# Patient Record
Sex: Female | Born: 1960 | Race: Black or African American | Hispanic: No | Marital: Single | Smoking: Never smoker
Health system: Southern US, Community
[De-identification: ages and names within clinical notes are randomized; demographics above are authoritative.]

## PROBLEM LIST (undated history)

## (undated) DIAGNOSIS — E785 Hyperlipidemia, unspecified: Secondary | ICD-10-CM

## (undated) DIAGNOSIS — M810 Age-related osteoporosis without current pathological fracture: Secondary | ICD-10-CM

## (undated) DIAGNOSIS — I10 Essential (primary) hypertension: Secondary | ICD-10-CM

## (undated) DIAGNOSIS — E119 Type 2 diabetes mellitus without complications: Secondary | ICD-10-CM

## (undated) DIAGNOSIS — M1990 Unspecified osteoarthritis, unspecified site: Secondary | ICD-10-CM

## (undated) DIAGNOSIS — N871 Moderate cervical dysplasia: Secondary | ICD-10-CM

## (undated) HISTORY — DX: Age-related osteoporosis without current pathological fracture: M81.0

## (undated) HISTORY — PX: CHOLECYSTECTOMY: SHX55

## (undated) HISTORY — PX: BREAST CYST ASPIRATION: SHX578

## (undated) HISTORY — DX: Hyperlipidemia, unspecified: E78.5

## (undated) HISTORY — PX: TUBAL LIGATION: SHX77

---

## 2006-04-17 ENCOUNTER — Other Ambulatory Visit: Admission: RE | Admit: 2006-04-17 | Discharge: 2006-04-17 | Payer: Self-pay | Admitting: Obstetrics and Gynecology

## 2006-09-04 ENCOUNTER — Encounter: Admission: RE | Admit: 2006-09-04 | Discharge: 2006-09-04 | Payer: Self-pay | Admitting: Obstetrics and Gynecology

## 2007-09-10 ENCOUNTER — Encounter: Admission: RE | Admit: 2007-09-10 | Discharge: 2007-09-10 | Payer: Self-pay | Admitting: Obstetrics and Gynecology

## 2007-11-21 ENCOUNTER — Encounter: Admission: RE | Admit: 2007-11-21 | Discharge: 2007-11-21 | Payer: Self-pay | Admitting: Internal Medicine

## 2008-01-29 ENCOUNTER — Emergency Department (HOSPITAL_COMMUNITY): Admission: EM | Admit: 2008-01-29 | Discharge: 2008-01-29 | Payer: Self-pay | Admitting: Emergency Medicine

## 2008-09-30 ENCOUNTER — Encounter: Admission: RE | Admit: 2008-09-30 | Discharge: 2008-09-30 | Payer: Self-pay | Admitting: Obstetrics and Gynecology

## 2009-10-02 ENCOUNTER — Encounter: Admission: RE | Admit: 2009-10-02 | Discharge: 2009-10-02 | Payer: Self-pay | Admitting: Obstetrics and Gynecology

## 2010-02-06 ENCOUNTER — Encounter: Admission: RE | Admit: 2010-02-06 | Discharge: 2010-02-06 | Payer: Self-pay | Admitting: Internal Medicine

## 2010-08-26 ENCOUNTER — Other Ambulatory Visit: Payer: Self-pay | Admitting: Obstetrics and Gynecology

## 2010-08-26 DIAGNOSIS — Z1231 Encounter for screening mammogram for malignant neoplasm of breast: Secondary | ICD-10-CM

## 2010-10-08 ENCOUNTER — Ambulatory Visit: Payer: Self-pay

## 2010-10-12 ENCOUNTER — Ambulatory Visit
Admission: RE | Admit: 2010-10-12 | Discharge: 2010-10-12 | Disposition: A | Discharge status: Home / Self Care | Payer: BC Managed Care – PPO | Source: Ambulatory Visit | Attending: Obstetrics and Gynecology | Admitting: Obstetrics and Gynecology

## 2010-10-12 DIAGNOSIS — Z1231 Encounter for screening mammogram for malignant neoplasm of breast: Secondary | ICD-10-CM

## 2011-08-29 ENCOUNTER — Other Ambulatory Visit: Payer: Self-pay

## 2011-08-29 ENCOUNTER — Other Ambulatory Visit: Payer: Self-pay | Admitting: Obstetrics and Gynecology

## 2011-08-29 DIAGNOSIS — Z1231 Encounter for screening mammogram for malignant neoplasm of breast: Secondary | ICD-10-CM

## 2011-10-18 ENCOUNTER — Ambulatory Visit
Admission: RE | Admit: 2011-10-18 | Discharge: 2011-10-18 | Disposition: A | Discharge status: Home / Self Care | Payer: BC Managed Care – PPO | Source: Ambulatory Visit | Attending: Obstetrics and Gynecology | Admitting: Obstetrics and Gynecology

## 2011-10-18 DIAGNOSIS — Z1231 Encounter for screening mammogram for malignant neoplasm of breast: Secondary | ICD-10-CM

## 2012-01-30 ENCOUNTER — Encounter: Payer: Self-pay | Admitting: Obstetrics and Gynecology

## 2012-05-24 ENCOUNTER — Encounter: Payer: Self-pay | Admitting: *Deleted

## 2012-05-24 ENCOUNTER — Encounter: Payer: BC Managed Care – PPO | Attending: Internal Medicine | Admitting: *Deleted

## 2012-05-24 VITALS — Ht 65.0 in | Wt 200.7 lb

## 2012-05-24 DIAGNOSIS — Z713 Dietary counseling and surveillance: Secondary | ICD-10-CM | POA: Insufficient documentation

## 2012-05-24 DIAGNOSIS — IMO0001 Reserved for inherently not codable concepts without codable children: Secondary | ICD-10-CM

## 2012-05-24 DIAGNOSIS — E119 Type 2 diabetes mellitus without complications: Secondary | ICD-10-CM | POA: Insufficient documentation

## 2012-05-24 NOTE — Progress Notes (Signed)
  HbA1c 6.5%  Patient was seen on 05/24/12 for the first of a series of three diabetes self-management courses at the [facility]. The following learning objectives were met by the patient during this course:   Defines the role of glucose and insulin  Identifies type of diabetes and pathophysiology  Defines the diagnostic criteria for diabetes and prediabetes  States the risk factors for Type 2 Diabetes  States the symptoms of Type 2 Diabetes  Defines Type 2 Diabetes treatment goals  Defines Type 2 Diabetes treatment options  States the rationale for glucose monitoring  Identifies A1C, glucose targets, and testing times  Identifies proper sharps disposal  Defines the purpose of a diabetes food plan  Identifies carbohydrate food groups  Defines effects of carbohydrate foods on glucose levels  Identifies carbohydrate choices/grams/food labels  States benefits of physical activity and effect on glucose  Review of suggested activity guidelines  Handouts given during class include:  Type 2 Diabetes: Basics Book  My Food Plan Book  Food and Activity Log  Follow-Up Plan: Attend Core 2 and Core 3 classes

## 2012-05-24 NOTE — Patient Instructions (Signed)
Goals:  Follow Diabetes Meal Plan as instructed  Eat 3 meals and 2 snacks, every 3-5 hrs  Limit carbohydrate intake to 30-45 grams carbohydrate/meal  Limit carbohydrate intake to 15 grams carbohydrate/snack  Add lean protein foods to meals/snacks  Monitor glucose levels as instructed by your doctor  Aim for 30 mins of physical activity daily  Bring food record and glucose log to your next nutrition visit 

## 2012-08-09 ENCOUNTER — Encounter: Payer: BC Managed Care – PPO | Attending: Internal Medicine | Admitting: Dietician

## 2012-08-09 DIAGNOSIS — E119 Type 2 diabetes mellitus without complications: Secondary | ICD-10-CM | POA: Insufficient documentation

## 2012-08-09 DIAGNOSIS — IMO0001 Reserved for inherently not codable concepts without codable children: Secondary | ICD-10-CM

## 2012-08-09 DIAGNOSIS — Z713 Dietary counseling and surveillance: Secondary | ICD-10-CM | POA: Insufficient documentation

## 2012-08-11 NOTE — Progress Notes (Signed)
  Patient was seen on 08/09/2012 for the second of a series of three diabetes self-management courses at the [facility]. The following learning objectives were met by the patient during this course:   Explain basic nutrition maintenance and quality assurance  Describe causes, symptoms and treatment of hypoglycemia and hyperglycemia  Explain how to manage diabetes during illness  Describe the importance of good nutrition for health and healthy eating strategies  List strategies to follow meal plan when dining out  Describe the effects of alcohol on glucose and how to use it safely  Describe problem solving skills for day-to-day glucose challenges  Describe strategies to use when treatment plan needs to change  Identify important factors involved in successful weight loss  Describe ways to remain physically active  Describe the impact of regular activity on insulin resistance   Handouts given in class:  Refrigerator magnet for Sick Day Guidelines  [facility] Oral medication/insulin handout  Nutritional Strategies For Weight Loss with Diabetes  Solving Glucose Puzzles Handout  Follow-Up Plan: Patient will attend the final class of the ADA Diabetes Self-Care Education.

## 2012-09-20 ENCOUNTER — Encounter: Payer: BC Managed Care – PPO | Attending: Internal Medicine | Admitting: *Deleted

## 2012-09-20 DIAGNOSIS — E119 Type 2 diabetes mellitus without complications: Secondary | ICD-10-CM | POA: Insufficient documentation

## 2012-09-20 DIAGNOSIS — Z713 Dietary counseling and surveillance: Secondary | ICD-10-CM | POA: Insufficient documentation

## 2012-09-20 NOTE — Progress Notes (Signed)
  Patient was seen on 09/20/12 for the third of a series of three diabetes self-management courses at the [facility]. The following learning objectives were met by the patient during this course:    Describe how diabetes changes over time   Identify diabetes complications and ways to prevent them   Describe strategies that can promote heart health including lowering blood pressure and cholesterol   Describe strategies to lower dietary fat and sodium in the diet   Identify physical activities that benefit cardiovascular health   Evaluate success in meeting personal goal   Describe the belief that they can live successfully with diabetes day to day   Establish 2-3 goals that they will plan to diligently work on until they return for the free 35-month follow-up visit  The following handouts were given in class:  3 Month Follow Up Visit handout  Goal setting handout  Class evaluation form  Your patient has established the following 3 month goals for diabetes self-care:  Count carbohydrates at most meals and snacks  Test my glucose every day  Follow-Up Plan: Patient will attend a 3 month follow-up visit for diabetes self-management education.

## 2012-10-03 ENCOUNTER — Other Ambulatory Visit: Payer: Self-pay

## 2012-10-03 DIAGNOSIS — Z1231 Encounter for screening mammogram for malignant neoplasm of breast: Secondary | ICD-10-CM

## 2012-11-01 ENCOUNTER — Ambulatory Visit
Admission: RE | Admit: 2012-11-01 | Discharge: 2012-11-01 | Disposition: A | Discharge status: Home / Self Care | Payer: BC Managed Care – PPO | Source: Ambulatory Visit

## 2012-11-01 DIAGNOSIS — Z1231 Encounter for screening mammogram for malignant neoplasm of breast: Secondary | ICD-10-CM

## 2013-01-14 ENCOUNTER — Ambulatory Visit: Payer: BC Managed Care – PPO | Admitting: *Deleted

## 2013-02-07 ENCOUNTER — Ambulatory Visit: Payer: BC Managed Care – PPO | Admitting: *Deleted

## 2013-09-24 ENCOUNTER — Other Ambulatory Visit: Payer: Self-pay

## 2013-10-07 ENCOUNTER — Other Ambulatory Visit: Payer: Self-pay | Admitting: Obstetrics and Gynecology

## 2013-10-07 DIAGNOSIS — Z1231 Encounter for screening mammogram for malignant neoplasm of breast: Secondary | ICD-10-CM

## 2013-11-05 ENCOUNTER — Encounter (INDEPENDENT_AMBULATORY_CARE_PROVIDER_SITE_OTHER): Payer: Self-pay

## 2013-11-05 ENCOUNTER — Ambulatory Visit
Admission: RE | Admit: 2013-11-05 | Discharge: 2013-11-05 | Disposition: A | Discharge status: Home / Self Care | Payer: BC Managed Care – PPO | Source: Ambulatory Visit | Attending: Obstetrics and Gynecology | Admitting: Obstetrics and Gynecology

## 2013-11-05 DIAGNOSIS — Z1231 Encounter for screening mammogram for malignant neoplasm of breast: Secondary | ICD-10-CM

## 2014-10-03 ENCOUNTER — Other Ambulatory Visit: Payer: Self-pay

## 2014-10-03 DIAGNOSIS — Z1231 Encounter for screening mammogram for malignant neoplasm of breast: Secondary | ICD-10-CM

## 2014-11-11 ENCOUNTER — Ambulatory Visit: Payer: BC Managed Care – PPO

## 2014-12-04 ENCOUNTER — Ambulatory Visit
Admission: RE | Admit: 2014-12-04 | Discharge: 2014-12-04 | Disposition: A | Discharge status: Home / Self Care | Payer: BC Managed Care – PPO | Source: Ambulatory Visit

## 2014-12-04 DIAGNOSIS — Z1231 Encounter for screening mammogram for malignant neoplasm of breast: Secondary | ICD-10-CM

## 2015-10-30 ENCOUNTER — Other Ambulatory Visit: Payer: Self-pay

## 2015-10-30 DIAGNOSIS — Z1231 Encounter for screening mammogram for malignant neoplasm of breast: Secondary | ICD-10-CM

## 2015-12-10 ENCOUNTER — Ambulatory Visit
Admission: RE | Admit: 2015-12-10 | Discharge: 2015-12-10 | Disposition: A | Discharge status: Home / Self Care | Payer: BC Managed Care – PPO | Source: Ambulatory Visit

## 2015-12-10 DIAGNOSIS — Z1231 Encounter for screening mammogram for malignant neoplasm of breast: Secondary | ICD-10-CM

## 2015-12-11 ENCOUNTER — Ambulatory Visit: Payer: BC Managed Care – PPO

## 2015-12-17 ENCOUNTER — Other Ambulatory Visit: Payer: Self-pay | Admitting: Obstetrics and Gynecology

## 2015-12-17 DIAGNOSIS — R1031 Right lower quadrant pain: Secondary | ICD-10-CM

## 2015-12-31 ENCOUNTER — Ambulatory Visit
Admission: RE | Admit: 2015-12-31 | Discharge: 2015-12-31 | Disposition: A | Discharge status: Home / Self Care | Payer: BC Managed Care – PPO | Source: Ambulatory Visit | Attending: Obstetrics and Gynecology | Admitting: Obstetrics and Gynecology

## 2015-12-31 DIAGNOSIS — R1031 Right lower quadrant pain: Secondary | ICD-10-CM

## 2015-12-31 MED ORDER — IOPAMIDOL (ISOVUE-300) INJECTION 61%
100.0000 mL | Freq: Once | INTRAVENOUS | Status: AC | PRN
  Administered 2015-12-31: 100 mL via INTRAVENOUS

## 2015-12-31 NOTE — Progress Notes (Signed)
Patient brought to nursing station at 10:53 after she began sneezing at the end of her CT scan with contrast.  Complained of her nose just non-stop running, blowing nose repeatedly with clear watery drainage on tissues.  Sounded stuffy but without hoarseness or breathing issues.  No other complaints.  Went to restroom without complications.  Observed her for about five more minutes, during which her stuffiness and runny nose resolved.  I gave patient a Benadryl 25mg  PO capsule to take with her in case her symptoms returned.  She is going straight to work at [employer] from here.  Her home is only five minutes from there.  I told her to take the Benadryl, if needed, before she left work and to go straight home before drowsiness kicks in.  She understands to call [phone] if she develops respiratory difficulties (i.e.shortness of breath).  Brita Romp, RN

## 2016-06-24 ENCOUNTER — Other Ambulatory Visit: Payer: Self-pay | Admitting: Nurse Practitioner

## 2016-06-24 ENCOUNTER — Ambulatory Visit
Admission: RE | Admit: 2016-06-24 | Discharge: 2016-06-24 | Disposition: A | Discharge status: Home / Self Care | Payer: BC Managed Care – PPO | Source: Ambulatory Visit | Attending: Nurse Practitioner | Admitting: Nurse Practitioner

## 2016-06-24 DIAGNOSIS — R1031 Right lower quadrant pain: Secondary | ICD-10-CM

## 2016-11-01 ENCOUNTER — Other Ambulatory Visit: Payer: Self-pay | Admitting: Obstetrics and Gynecology

## 2016-11-01 DIAGNOSIS — Z1231 Encounter for screening mammogram for malignant neoplasm of breast: Secondary | ICD-10-CM

## 2016-12-12 ENCOUNTER — Ambulatory Visit
Admission: RE | Admit: 2016-12-12 | Discharge: 2016-12-12 | Disposition: A | Discharge status: Home / Self Care | Payer: BC Managed Care – PPO | Source: Ambulatory Visit | Attending: Obstetrics and Gynecology | Admitting: Obstetrics and Gynecology

## 2016-12-12 DIAGNOSIS — Z1231 Encounter for screening mammogram for malignant neoplasm of breast: Secondary | ICD-10-CM

## 2017-11-07 ENCOUNTER — Other Ambulatory Visit: Payer: Self-pay | Admitting: Obstetrics and Gynecology

## 2017-11-07 DIAGNOSIS — Z1231 Encounter for screening mammogram for malignant neoplasm of breast: Secondary | ICD-10-CM

## 2017-12-13 ENCOUNTER — Ambulatory Visit
Admission: RE | Admit: 2017-12-13 | Discharge: 2017-12-13 | Disposition: A | Discharge status: Home / Self Care | Payer: BC Managed Care – PPO | Source: Ambulatory Visit | Attending: Obstetrics and Gynecology | Admitting: Obstetrics and Gynecology

## 2017-12-13 DIAGNOSIS — Z1231 Encounter for screening mammogram for malignant neoplasm of breast: Secondary | ICD-10-CM

## 2018-01-08 ENCOUNTER — Other Ambulatory Visit: Payer: Self-pay | Admitting: Internal Medicine

## 2018-01-08 DIAGNOSIS — R319 Hematuria, unspecified: Secondary | ICD-10-CM

## 2018-01-12 ENCOUNTER — Ambulatory Visit
Admission: RE | Admit: 2018-01-12 | Discharge: 2018-01-12 | Disposition: A | Discharge status: Home / Self Care | Payer: BC Managed Care – PPO | Source: Ambulatory Visit | Attending: Internal Medicine | Admitting: Internal Medicine

## 2018-01-12 DIAGNOSIS — R319 Hematuria, unspecified: Secondary | ICD-10-CM

## 2018-02-28 ENCOUNTER — Other Ambulatory Visit: Payer: Self-pay | Admitting: Obstetrics and Gynecology

## 2018-04-16 ENCOUNTER — Encounter (HOSPITAL_BASED_OUTPATIENT_CLINIC_OR_DEPARTMENT_OTHER): Payer: Self-pay | Admitting: *Deleted

## 2018-04-17 ENCOUNTER — Encounter (HOSPITAL_COMMUNITY)
Admission: RE | Admit: 2018-04-17 | Discharge: 2018-04-17 | Disposition: A | Discharge status: Home / Self Care | Payer: BC Managed Care – PPO | Source: Ambulatory Visit | Attending: Obstetrics and Gynecology | Admitting: Obstetrics and Gynecology

## 2018-04-17 ENCOUNTER — Encounter (HOSPITAL_BASED_OUTPATIENT_CLINIC_OR_DEPARTMENT_OTHER): Payer: Self-pay | Admitting: *Deleted

## 2018-04-17 ENCOUNTER — Other Ambulatory Visit: Payer: Self-pay

## 2018-04-17 DIAGNOSIS — Z01818 Encounter for other preprocedural examination: Secondary | ICD-10-CM | POA: Insufficient documentation

## 2018-04-17 LAB — BASIC METABOLIC PANEL
Anion gap: 6 (ref 5–15)
BUN: 11 mg/dL (ref 6–20)
CO2: 25 mmol/L (ref 22–32)
Calcium: 8.7 mg/dL — ABNORMAL LOW (ref 8.9–10.3)
Chloride: 108 mmol/L (ref 98–111)
Creatinine, Ser: 0.81 mg/dL (ref 0.44–1.00)
GFR calc Af Amer: >60 mL/min (ref >60)
GFR calc non Af Amer: >60 mL/min (ref >60)
Glucose, Bld: 97 mg/dL (ref 70–99)
Potassium: 3.5 mmol/L (ref 3.5–5.1)
Sodium: 139 mmol/L (ref 135–145)

## 2018-04-17 LAB — CBC
HCT: 40.8 % (ref 36.0–46.0)
Hemoglobin: 13.6 g/dL (ref 12.0–15.0)
MCH: 30.9 pg (ref 26.0–34.0)
MCHC: 33.3 g/dL (ref 30.0–36.0)
MCV: 92.7 fL (ref 80.0–100.0)
Platelets: 173 10*3/uL (ref 150–400)
RBC: 4.4 MIL/uL (ref 3.87–5.11)
RDW: 11.9 % (ref 11.5–15.5)
WBC: 6.7 10*3/uL (ref 4.0–10.5)
nRBC: 0 % (ref 0.0–0.2)

## 2018-04-17 LAB — ABO/RH: ABO/RH(D): A POS

## 2018-04-17 NOTE — Progress Notes (Signed)
Spoke with Haely Npo after midnight arrive 1000 am 04-19-18 [facility] meds to take none Driver sister adrian benton Pre op lab appointment today for cbc, bmey, type and schreen and ekg Have surgery orders in epic

## 2018-04-18 NOTE — Progress Notes (Signed)
Final ekg and cbc,bmet and type and screen done 04-17-18 on chart

## 2018-04-19 ENCOUNTER — Ambulatory Visit (HOSPITAL_BASED_OUTPATIENT_CLINIC_OR_DEPARTMENT_OTHER)
Admission: RE | Admit: 2018-04-19 | Discharge: 2018-04-19 | Disposition: A | Discharge status: Home / Self Care | Payer: BC Managed Care – PPO | Source: Other Acute Inpatient Hospital | Attending: Obstetrics and Gynecology | Admitting: Obstetrics and Gynecology

## 2018-04-19 ENCOUNTER — Encounter (HOSPITAL_BASED_OUTPATIENT_CLINIC_OR_DEPARTMENT_OTHER): Payer: Self-pay | Admitting: *Deleted

## 2018-04-19 ENCOUNTER — Ambulatory Visit (HOSPITAL_BASED_OUTPATIENT_CLINIC_OR_DEPARTMENT_OTHER): Payer: BC Managed Care – PPO | Admitting: Anesthesiology

## 2018-04-19 ENCOUNTER — Encounter (HOSPITAL_BASED_OUTPATIENT_CLINIC_OR_DEPARTMENT_OTHER)
Admission: RE | Disposition: A | Discharge status: Home / Self Care | Payer: Self-pay | Source: Other Acute Inpatient Hospital | Attending: Obstetrics and Gynecology

## 2018-04-19 ENCOUNTER — Other Ambulatory Visit: Payer: Self-pay

## 2018-04-19 DIAGNOSIS — R87613 High grade squamous intraepithelial lesion on cytologic smear of cervix (HGSIL): Secondary | ICD-10-CM | POA: Diagnosis present

## 2018-04-19 DIAGNOSIS — N870 Mild cervical dysplasia: Secondary | ICD-10-CM | POA: Insufficient documentation

## 2018-04-19 DIAGNOSIS — E119 Type 2 diabetes mellitus without complications: Secondary | ICD-10-CM | POA: Insufficient documentation

## 2018-04-19 DIAGNOSIS — E1169 Type 2 diabetes mellitus with other specified complication: Secondary | ICD-10-CM

## 2018-04-19 DIAGNOSIS — E785 Hyperlipidemia, unspecified: Secondary | ICD-10-CM | POA: Insufficient documentation

## 2018-04-19 DIAGNOSIS — I10 Essential (primary) hypertension: Secondary | ICD-10-CM | POA: Diagnosis present

## 2018-04-19 HISTORY — DX: Essential (primary) hypertension: I10

## 2018-04-19 HISTORY — DX: Unspecified osteoarthritis, unspecified site: M19.90

## 2018-04-19 HISTORY — DX: Type 2 diabetes mellitus without complications: E11.9

## 2018-04-19 HISTORY — PX: CERVICAL CONIZATION W/BX: SHX1330

## 2018-04-19 HISTORY — DX: Moderate cervical dysplasia: N87.1

## 2018-04-19 LAB — TYPE AND SCREEN
ABO/RH(D): A POS
Antibody Screen: NEGATIVE

## 2018-04-19 SURGERY — CONE BIOPSY, CERVIX
Anesthesia: Monitor Anesthesia Care | Site: Vagina

## 2018-04-19 MED ORDER — DEXMEDETOMIDINE HCL IN NACL 200 MCG/50ML IV SOLN
INTRAVENOUS | Status: AC
  Filled 2018-04-19: qty 50

## 2018-04-19 MED ORDER — LIDOCAINE 2% (20 MG/ML) 5 ML SYRINGE
INTRAMUSCULAR | Status: DC | PRN
  Administered 2018-04-19: 60 mg via INTRAVENOUS

## 2018-04-19 MED ORDER — ONDANSETRON HCL 4 MG/2ML IJ SOLN
INTRAMUSCULAR | Status: DC | PRN
  Administered 2018-04-19: 4 mg via INTRAVENOUS

## 2018-04-19 MED ORDER — DEXMEDETOMIDINE HCL 200 MCG/2ML IV SOLN
INTRAVENOUS | Status: DC | PRN
  Administered 2018-04-19: 8 ug via INTRAVENOUS
  Administered 2018-04-19: 24 ug via INTRAVENOUS
  Administered 2018-04-19: 8 ug via INTRAVENOUS

## 2018-04-19 MED ORDER — KETOROLAC TROMETHAMINE 60 MG/2ML IM SOLN
INTRAMUSCULAR | Status: DC | PRN
  Administered 2018-04-19: 30 mg via INTRAMUSCULAR

## 2018-04-19 MED ORDER — DEXAMETHASONE SODIUM PHOSPHATE 10 MG/ML IJ SOLN
INTRAMUSCULAR | Status: AC
  Filled 2018-04-19: qty 1

## 2018-04-19 MED ORDER — KETOROLAC TROMETHAMINE 30 MG/ML IJ SOLN
INTRAMUSCULAR | Status: AC
  Filled 2018-04-19: qty 1

## 2018-04-19 MED ORDER — DEXAMETHASONE SODIUM PHOSPHATE 10 MG/ML IJ SOLN
INTRAMUSCULAR | Status: DC | PRN
  Administered 2018-04-19: 10 mg via INTRAVENOUS

## 2018-04-19 MED ORDER — KETOROLAC TROMETHAMINE 30 MG/ML IJ SOLN
INTRAMUSCULAR | Status: DC | PRN
  Administered 2018-04-19: 30 mg via INTRAVENOUS

## 2018-04-19 MED ORDER — IODINE STRONG (LUGOLS) 5 % PO SOLN
ORAL | Status: DC | PRN
  Administered 2018-04-19: 10 mL

## 2018-04-19 MED ORDER — IBUPROFEN 600 MG PO TABS: ORAL_TABLET | ORAL | 0 refills | Status: DC

## 2018-04-19 MED ORDER — LACTATED RINGERS IV SOLN
INTRAVENOUS | Status: DC
  Administered 2018-04-19 (×3): via INTRAVENOUS
  Filled 2018-04-19: qty 1000

## 2018-04-19 MED ORDER — MIDAZOLAM HCL 2 MG/2ML IJ SOLN
INTRAMUSCULAR | Status: AC
  Filled 2018-04-19: qty 2

## 2018-04-19 MED ORDER — FENTANYL CITRATE (PF) 100 MCG/2ML IJ SOLN
INTRAMUSCULAR | Status: AC
  Filled 2018-04-19: qty 2

## 2018-04-19 MED ORDER — LIDOCAINE HCL 2 % IJ SOLN
INTRAMUSCULAR | Status: DC | PRN
  Administered 2018-04-19: 10 mL

## 2018-04-19 MED ORDER — FENTANYL CITRATE (PF) 100 MCG/2ML IJ SOLN
INTRAMUSCULAR | Status: DC | PRN
  Administered 2018-04-19 (×4): 25 ug via INTRAVENOUS

## 2018-04-19 MED ORDER — VASOPRESSIN 20 UNIT/ML IV SOLN
INTRAVENOUS | Status: DC | PRN
  Administered 2018-04-19: 10 mL via INTRAMUSCULAR
  Administered 2018-04-19: 27 mL via INTRAMUSCULAR

## 2018-04-19 MED ORDER — MIDAZOLAM HCL 2 MG/2ML IJ SOLN
INTRAMUSCULAR | Status: DC | PRN
  Administered 2018-04-19: 2 mg via INTRAVENOUS
  Administered 2018-04-19 (×2): .5 mg via INTRAVENOUS
  Administered 2018-04-19: 1 mg via INTRAVENOUS

## 2018-04-19 MED ORDER — PROPOFOL 10 MG/ML IV BOLUS
INTRAVENOUS | Status: AC
  Filled 2018-04-19: qty 20

## 2018-04-19 MED ORDER — LIDOCAINE 2% (20 MG/ML) 5 ML SYRINGE
INTRAMUSCULAR | Status: AC
  Filled 2018-04-19: qty 5

## 2018-04-19 MED ORDER — GLYCOPYRROLATE 0.2 MG/ML IJ SOLN
INTRAMUSCULAR | Status: DC | PRN
  Administered 2018-04-19: 0.2 mg via INTRAVENOUS

## 2018-04-19 MED ORDER — PROPOFOL 500 MG/50ML IV EMUL
INTRAVENOUS | Status: AC
  Filled 2018-04-19: qty 50

## 2018-04-19 MED ORDER — ONDANSETRON HCL 4 MG/2ML IJ SOLN
INTRAMUSCULAR | Status: AC
  Filled 2018-04-19: qty 2

## 2018-04-19 MED ORDER — PROPOFOL 500 MG/50ML IV EMUL
INTRAVENOUS | Status: DC | PRN
  Administered 2018-04-19: 50 ug/kg/min via INTRAVENOUS
  Administered 2018-04-19: 25 ug/kg/min via INTRAVENOUS

## 2018-04-19 SURGICAL SUPPLY — 35 items
APPLICATOR COTTON TIP 6IN STRL (MISCELLANEOUS) IMPLANT
BLADE HEX COATED 2.75 (ELECTRODE) ×2 IMPLANT
BLADE SURG 11 STRL SS (BLADE) ×2 IMPLANT
CATH ROBINSON RED A/P 16FR (CATHETERS) ×2 IMPLANT
COVER WAND RF STERILE (DRAPES) ×1 IMPLANT
ELECT BALL LEEP 3MM BLK (ELECTRODE) IMPLANT
ELECT BALL LEEP 5MM RED (ELECTRODE) IMPLANT
ELECT LOOP LEEP RND 15X12 GRN (CUTTING LOOP)
ELECT LOOP LEEP RND 20X12 WHT (CUTTING LOOP)
ELECT REM PT RETURN 9FT ADLT (ELECTROSURGICAL)
ELECTRODE LOOP LP RND 15X12GRN (CUTTING LOOP) IMPLANT
ELECTRODE LOOP LP RND 20X12WHT (CUTTING LOOP) IMPLANT
ELECTRODE REM PT RTRN 9FT ADLT (ELECTROSURGICAL) IMPLANT
GLOVE BIOGEL PI IND STRL 7.0 (GLOVE) IMPLANT
GLOVE BIOGEL PI IND STRL 7.5 (GLOVE) IMPLANT
GLOVE BIOGEL PI INDICATOR 7.0 (GLOVE) ×1
GLOVE BIOGEL PI INDICATOR 7.5 (GLOVE) ×2
GLOVE ECLIPSE 7.0 STRL STRAW (GLOVE) ×1 IMPLANT
GLOVE SURG SS PI 6.5 STRL IVOR (GLOVE) ×3 IMPLANT
GOWN STRL REUS W/TWL LRG LVL3 (GOWN DISPOSABLE) ×2 IMPLANT
KIT TURNOVER CYSTO (KITS) ×2 IMPLANT
NDL SPNL 22GX5 LNG QUINC BK (NEEDLE) ×1 IMPLANT
NEEDLE SPNL 22GX5 LNG QUINC BK (NEEDLE) ×2 IMPLANT
PACK VAGINAL WOMENS (CUSTOM PROCEDURE TRAY) ×2 IMPLANT
PAD OB MATERNITY 4.3X12.25 (PERSONAL CARE ITEMS) ×2 IMPLANT
SCOPETTES 8  STERILE (MISCELLANEOUS) ×1
SCOPETTES 8 STERILE (MISCELLANEOUS) ×1 IMPLANT
SPONGE SURGIFOAM ABS GEL 12-7 (HEMOSTASIS) ×2 IMPLANT
SUT VIC AB 0 CT1 18XCR BRD8 (SUTURE) ×1 IMPLANT
SUT VIC AB 0 CT1 8-18 (SUTURE) ×2
SUT VIC AB 0 CT2 27 (SUTURE) IMPLANT
SYR CONTROL 10ML LL (SYRINGE) ×2 IMPLANT
SYR TB 1ML 27GX1/2 SAFE (SYRINGE) IMPLANT
SYR TB 1ML 27GX1/2 SAFETY (SYRINGE)
TOWEL OR 17X24 6PK STRL BLUE (TOWEL DISPOSABLE) ×4 IMPLANT

## 2018-04-19 NOTE — Discharge Instructions (Signed)
Cervical Conization, Care After °This sheet gives you information about how to care for yourself after your procedure. Your doctor may also give you more specific instructions. If you have problems or questions, contact your doctor. °Follow these instructions at home: °Medicines °· Take over-the-counter and prescription medicines only as told by your doctor. °· Do not take aspirin until your doctor says it is okay. °· If you take pain medicine: °? You may have constipation. To help treat this, your doctor may tell you to: °§ Drink enough fluid to keep your pee (urine) clear or pale yellow. °§ Take medicines. °§ Eat foods that are high in fiber. These include fresh fruits and vegetables, whole grains, bran, and beans. °§ Limit foods that are high in fat and sugar. These include fried foods and sweet foods. °? Do not drive or use heavy machines. °General instructions °· You can eat your usual diet unless your doctor tells you not to do so. °· Take showers for the first week. Do not take baths, swim, or use hot tubs until your doctor says it is okay. °· Do not douche, use tampons, or have sex until your doctor says it is okay. °· For 7-14 days after your procedure, avoid: °? Being very active. °? Exercising. °? Heavy lifting. °· Keep all follow-up visits as told by your doctor. This is important. °Contact a doctor if: °· You have a rash. °· You are dizzy or lightheaded. °· You feel sick to your stomach (nauseous). °· You throw up (vomit). °· You have fluid from your vagina (vaginal discharge) that smells bad. °Get help right away if: °· There are blood clots coming from your vagina. °· You have more bleeding than you would have in a normal period. For example, you soak a pad in less than 1 hour. °· You have a fever. °· You have more and more cramps. °· You pass out (faint). °· You have pain when peeing. °· Your have a lot of pain. °· Your pain gets worse. °· Your pain does not get better when you take your  medicine. °· You have blood in your pee. °· You throw up (vomit). °Summary °· After your procedure, take over-the-counter and prescription medicines only as told by your doctor. °· Do not douche, use tampons, or have sex until your doctor says it is okay. °· For about 7-14 days after your procedure, try not to exercise or lift heavy objects. °· Get help right away if you have new symptoms, or if your symptoms become worse. °This information is not intended to replace advice given to you by your health care provider. Make sure you discuss any questions you have with your health care provider. °Document Released: 03/29/2008 Document Revised: 06/22/2016 Document Reviewed: 06/22/2016 °Elsevier Interactive Patient Education © 2017 [employer] ° ° °Post Anesthesia Home Care Instructions ° °Activity: °Get plenty of rest for the remainder of the day. A responsible individual must stay with you for 24 hours following the procedure.  °For the next 24 hours, DO NOT: °-Drive a car °-Operate machinery °-Drink alcoholic beverages °-Take any medication unless instructed by your physician °-Make any legal decisions or sign important papers. ° °Meals: °Start with liquid foods such as gelatin or soup. Progress to regular foods as tolerated. Avoid greasy, spicy, heavy foods. If nausea and/or vomiting occur, drink only clear liquids until the nausea and/or vomiting subsides. Call your physician if vomiting continues. ° °Special Instructions/Symptoms: °Your throat may feel dry or sore from   from the anesthesia or the breathing tube placed in your throat during surgery. If this causes discomfort, gargle with warm salt water. The discomfort should disappear within 24 hours.

## 2018-04-19 NOTE — Op Note (Signed)
Procedure(s): CONIZATION CERVIX WITH BIOPSY Procedure Note  Nichole George female 57 y.o. 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  The endocervical canal sounded to 3 cm.  There were no Lugol's nonstaining areas on the exocervix.  Estimated Blood Loss:  Minimal         Drains: None          Blood Given: none          Specimens: Cervical cone.  Endocervical curettings.  Endometrial curettings.                Complications: None         Disposition: PACU - hemodynamically stable.         Condition: stable Procedure: The patient was taken to the operating room after appropriate identification and placed on the operating table.  After the attainment of adequate anesthesia she was placed in the lithotomy position.  A tach timeout was performed.  The perineum and outer vagina were prepped with multiple layers of Betadine and draped as a sterile field.  In and out catheterization was performed.  After draping as a sterile field a Graves speculum was placed in the vagina and a paracervical block achieved with a total of 10 cc of 2% Xylocaine in the 5 and 7:00 positions.  Static sutures were placed laterally at the 3 and 9:00 positions and held.  The cervix was infiltrated with a dilute solution of Pitressin circumferentially.  Lugol stain was applied.  Uterus was sounded and the cervix length measured.  A cone shaped specimen including the endocervix was then excised and a marking suture placed at the  12 o'clock position.  Endocervical curettings were minimal and obtained.  Endometrial curettings were likewise minimal and obtained.  The cone bed was then closed with Sturmdorf sutures in the 12 and 6:00 positions and hemostatic sutures in the 3 and 9:00 positions.  Stasis was noted to be adequate and Gelfoam placed in the conization bed.  The patient was awakened from general anesthesia and taken to the recovery room in satisfactory condition having tolerated the procedure well with sponge and instrument counts correct.  SCD hose were used throughout the entire surgery.

## 2018-04-19 NOTE — Anesthesia Postprocedure Evaluation (Signed)
Anesthesia Post Note  Patient: Nichole George  Procedure(s) Performed: CONIZATION CERVIX WITH BIOPSY (N/A Vagina )     Patient location during evaluation: PACU Anesthesia Type: MAC Level of consciousness: awake and alert Pain management: pain level controlled Vital Signs Assessment: post-procedure vital signs reviewed and stable Respiratory status: spontaneous breathing, nonlabored ventilation, respiratory function stable and patient connected to nasal cannula oxygen Cardiovascular status: stable and blood pressure returned to baseline Postop Assessment: no apparent nausea or vomiting Anesthetic complications: no    Last Vitals:  Vitals:   04/19/18 1345 04/19/18 1400  BP: 101/61 (!) 103/50  Pulse: (!) 56 64  Resp: 11 20  Temp:    SpO2: 93% 94%    Last Pain:  Vitals:   04/19/18 1430  TempSrc:   PainSc: 0-No pain                 Barnet Glasgow

## 2018-04-19 NOTE — Transfer of Care (Signed)
Immediate Anesthesia Transfer of Care Note  Patient: Nichole George  Procedure(s) Performed: Procedure(s) (LRB): CONIZATION CERVIX WITH BIOPSY (N/A)  Patient Location: PACU  Anesthesia Type: General  Level of Consciousness: awake, sedated, patient cooperative and responds to stimulation  Airway & Oxygen Therapy: Patient Spontanous Breathing and Patient connected to face mask oxygen  Post-op Assessment: Report given to PACU RN, Post -op Vital signs reviewed and stable and Patient moving all extremities  Post vital signs: Reviewed and stable  Complications: No apparent anesthesia complications

## 2018-04-19 NOTE — Anesthesia Procedure Notes (Signed)
Procedure Name: MAC Date/Time: 04/19/2018 11:55 AM Performed by: Aguila Rocher, CRNA Pre-anesthesia Checklist: Patient identified, Emergency Drugs available, Suction available, Patient being monitored and Timeout performed Patient Re-evaluated:Patient Re-evaluated prior to induction Oxygen Delivery Method: Simple face mask Preoxygenation: Pre-oxygenation with 100% oxygen Induction Type: IV induction Placement Confirmation: breath sounds checked- equal and bilateral and positive ETCO2

## 2018-04-19 NOTE — Anesthesia Preprocedure Evaluation (Addendum)
Anesthesia Evaluation  Patient identified by MRN, date of birth, ID band Patient awake    Reviewed: Allergy & Precautions, NPO status , Patient's Chart, lab work & pertinent test results  Airway Mallampati: II  TM Distance: >3 FB Neck ROM: Full    Dental no notable dental hx. (+) Dental Advisory Given   Pulmonary neg pulmonary ROS,    Pulmonary exam normal        Cardiovascular hypertension, Pt. on medications Normal cardiovascular exam     Neuro/Psych negative neurological ROS  negative psych ROS   GI/Hepatic negative GI ROS, Neg liver ROS,   Endo/Other  diabetes  Renal/GU negative Renal ROS  negative genitourinary   Musculoskeletal negative musculoskeletal ROS (+)   Abdominal   Peds negative pediatric ROS (+)  Hematology negative hematology ROS (+)   Anesthesia Other Findings   Reproductive/Obstetrics negative OB ROS                             Anesthesia Physical Anesthesia Plan  ASA: II  Anesthesia Plan: MAC   Post-op Pain Management:    Induction: Intravenous  PONV Risk Score and Plan: 3 and Ondansetron, Dexamethasone and Propofol infusion  Airway Management Planned: Natural Airway and Simple Face Mask  Additional Equipment:   Intra-op Plan:   Post-operative Plan:   Informed Consent: I have reviewed the patients History and Physical, chart, labs and discussed the procedure including the risks, benefits and alternatives for the proposed anesthesia with the patient or authorized representative who has indicated his/her understanding and acceptance.   Dental advisory given  Plan Discussed with: CRNA and Anesthesiologist  Anesthesia Plan Comments:        Anesthesia Quick Evaluation

## 2018-04-19 NOTE — H&P (Signed)
Nichole George is an 57 y.o. female. Pt admitted for further evaluation and treatment of HGSIL on ECC.  Has hx of LGSIL in 2009 with observation  as chosen management.  Pertinent Gynecological History: Menses: post-menopausal Bleeding: none Contraception: tubal ligation DES exposure: unknown Blood transfusions: none Sexually transmitted diseases: no past history Previous GYN Procedures: c- section  Last mammogram: normal Date: 12/2017 Last pap: abnormal: 2019 Date: 2019 OB History: G3, P3   Menstrual History: Menarche age: 52 No LMP recorded. Patient is postmenopausal.    Past Medical History:  Diagnosis Date  . Arthritis    left ankle and right big toe and both knees  . Cervical dysplasia, moderate   . Hyperlipidemia   . Hypertension   . Type 2 diabetes mellitus (HCC)    diet  and exercise controlled    Past Surgical History:  Procedure Laterality Date  . CESAREAN SECTION    . CHOLECYSTECTOMY    . TUBAL LIGATION      Family History  Problem Relation Age of Onset  . Cancer Other   . Hypertension Other   . Hyperlipidemia Other     Social History:  reports that she has never smoked. She has never used smokeless tobacco. She reports that she drinks alcohol. She reports that she does not use drugs.  Allergies: No Known Allergies  Medications Prior to Admission  Medication Sig Dispense Refill Last Dose  . valsartan (DIOVAN) 160 MG tablet Take 160 mg by mouth at bedtime.       Review of Systems  Constitutional: Negative.   HENT: Negative.   Respiratory: Negative.   Cardiovascular: Negative.   Gastrointestinal: Negative.   Genitourinary: Negative.   Musculoskeletal: Positive for joint pain.  Neurological: Negative.   Psychiatric/Behavioral: Negative.     Blood pressure 133/82, pulse 66, temperature (!) 97.5 F (36.4 C), temperature source Oral, resp. rate 16, height 5' 5" (1.651 m), weight 88.6 kg, SpO2 95 %. Physical Exam  Constitutional: She is oriented  to person, place, and time. She appears well-developed.  HENT:  Head: Normocephalic and atraumatic.  Eyes: EOM are normal.  Neck: Normal range of motion. Neck supple.  Cardiovascular: Normal rate.  Respiratory: Effort normal and breath sounds normal.  GI: Soft. Bowel sounds are normal.  Limited by body habitus  Genitourinary:  Genitourinary Comments: Pelvic exam:  VULVA: normal appearing vulva with no masses, tenderness or lesions,  VAGINA: atrophic,  CERVIX: normal appearing cervix without discharge or lesions,  UTERUS: enlarged to 8 week's size, irregular,exam limited by body habitus  ADNEXA: normal adnexa in size, nontender and no masses,  exam limited by body habitus.  Neurological: She is alert and oriented to person, place, and time.  Skin: Skin is warm and dry.  Psychiatric: She has a normal mood and affect.    Recent Results (from the past 2160 hour(s))  Type and screen [place]     Status: None   Collection Time: 04/17/18  4:03 PM  Result Value Ref Range   ABO/RH(D) A POS    Antibody Screen NEG    Sample Expiration 04/22/2018    Extend sample reason      NO TRANSFUSIONS OR PREGNANCY IN THE PAST 3 MONTHS Performed at [facility], [place] [address] [place], [place] [address]   CBC     Status: None   Collection Time: 04/17/18  4:11 PM  Result Value Ref Range   WBC 6.7 4.0 - 10.5 K/uL  RBC 4.40 3.87 - 5.11 MIL/uL   Hemoglobin 13.6 12.0 - 15.0 g/dL   HCT 40.8 36.0 - 46.0 %   MCV 92.7 80.0 - 100.0 fL   MCH 30.9 26.0 - 34.0 pg   MCHC 33.3 30.0 - 36.0 g/dL   RDW 11.9 11.5 - 15.5 %   Platelets 173 150 - 400 K/uL   nRBC 0.0 0.0 - 0.2 %    Comment: Performed at [facility], [place] [address] [place], [place] [address]  Basic metabolic panel     Status: Abnormal   Collection Time: 04/17/18  4:11 PM  Result Value Ref Range   Sodium 139 135 - 145 mmol/L   Potassium 3.5 3.5 - 5.1 mmol/L   Chloride 108 98 - 111  mmol/L   CO2 25 22 - 32 mmol/L   Glucose, Bld 97 70 - 99 mg/dL   BUN 11 6 - 20 mg/dL   Creatinine, Ser 0.81 0.44 - 1.00 mg/dL   Calcium 8.7 (L) 8.9 - 10.3 mg/dL   GFR calc non Af Amer >60 >60 mL/min   GFR calc Af Amer >60 >60 mL/min    Comment: (NOTE) The eGFR has been calculated using the CKD EPI equation. This calculation has not been validated in all clinical situations. eGFR's persistently <60 mL/min signify possible Chronic Kidney Disease.    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  Recommendation.  Options for observation with risk of progression to cervical cancer vx excisional treatment reviewed. Conization of the cervix recommended and risks of surgery reviewed as anesthesia, infection, bleeding and damage to adjacent organs. She wishes to proceed.Lorriane Shire P Charlis Harner 04/19/2018, 10:32 AM

## 2018-04-20 ENCOUNTER — Encounter (HOSPITAL_BASED_OUTPATIENT_CLINIC_OR_DEPARTMENT_OTHER): Payer: Self-pay | Admitting: Obstetrics and Gynecology

## 2018-06-19 ENCOUNTER — Other Ambulatory Visit: Payer: Self-pay | Admitting: Internal Medicine

## 2018-06-26 ENCOUNTER — Encounter: Payer: Self-pay | Admitting: Gastroenterology

## 2018-07-09 ENCOUNTER — Ambulatory Visit: Payer: BC Managed Care – PPO | Admitting: Internal Medicine

## 2018-07-09 ENCOUNTER — Encounter: Payer: Self-pay | Admitting: Internal Medicine

## 2018-07-09 VITALS — BP 144/86 | HR 68 | Temp 98.7°F | Ht 62.0 in | Wt 196.8 lb

## 2018-07-09 DIAGNOSIS — E66812 Obesity, class 2: Secondary | ICD-10-CM | POA: Insufficient documentation

## 2018-07-09 DIAGNOSIS — R7303 Prediabetes: Secondary | ICD-10-CM

## 2018-07-09 DIAGNOSIS — I10 Essential (primary) hypertension: Secondary | ICD-10-CM | POA: Diagnosis not present

## 2018-07-09 DIAGNOSIS — M62838 Other muscle spasm: Secondary | ICD-10-CM | POA: Diagnosis not present

## 2018-07-09 DIAGNOSIS — Z6836 Body mass index (BMI) 36.0-36.9, adult: Secondary | ICD-10-CM

## 2018-07-09 LAB — LIPID PANEL
Chol/HDL Ratio: 2.8 ratio (ref 0.0–4.4)
Cholesterol, Total: 204 mg/dL — ABNORMAL HIGH (ref 100–199)
HDL: 74 mg/dL (ref >39)
LDL Calculated: 108 mg/dL — ABNORMAL HIGH (ref 0–99)
Triglycerides: 108 mg/dL (ref 0–149)
VLDL Cholesterol Cal: 22 mg/dL (ref 5–40)

## 2018-07-09 LAB — CMP14+EGFR
ALT: 58 IU/L — ABNORMAL HIGH (ref 0–32)
AST: 51 IU/L — ABNORMAL HIGH (ref 0–40)
Albumin/Globulin Ratio: 1.8 (ref 1.2–2.2)
Albumin: 4.4 g/dL (ref 3.5–5.5)
Alkaline Phosphatase: 96 IU/L (ref 39–117)
BUN/Creatinine Ratio: 12 (ref 9–23)
BUN: 11 mg/dL (ref 6–24)
Bilirubin Total: 0.6 mg/dL (ref 0.0–1.2)
CO2: 23 mmol/L (ref 20–29)
Calcium: 9.6 mg/dL (ref 8.7–10.2)
Chloride: 102 mmol/L (ref 96–106)
Creatinine, Ser: 0.9 mg/dL (ref 0.57–1.00)
GFR calc Af Amer: 82 mL/min/{1.73_m2} (ref >59)
GFR calc non Af Amer: 71 mL/min/{1.73_m2} (ref >59)
Globulin, Total: 2.5 g/dL (ref 1.5–4.5)
Glucose: 114 mg/dL — ABNORMAL HIGH (ref 65–99)
Potassium: 4 mmol/L (ref 3.5–5.2)
Sodium: 140 mmol/L (ref 134–144)
Total Protein: 6.9 g/dL (ref 6.0–8.5)

## 2018-07-09 LAB — HEMOGLOBIN A1C
Est. average glucose Bld gHb Est-mCnc: 126 mg/dL
Hgb A1c MFr Bld: 6 % — ABNORMAL HIGH (ref 4.8–5.6)

## 2018-07-09 NOTE — Progress Notes (Signed)
Subjective:     Patient ID: Nichole George , female    DOB: Nov 13, 1960 , 58 y.o.   MRN: [id number]   Chief Complaint  Patient presents with  . Hypertension    HPI  Hypertension  This is a chronic problem. The current episode started more than 1 year ago. The problem has been gradually improving since onset. The problem is uncontrolled. Associated symptoms include neck pain. Risk factors for coronary artery disease include obesity and post-menopausal state.   She reports compliance with meds. Admits to eating liberally over the holidays.   Past Medical History:  Diagnosis Date  . Arthritis    left ankle and right big toe and both knees  . Cervical dysplasia, moderate   . Hyperlipidemia   . Hypertension   . Type 2 diabetes mellitus (HCC)    diet  and exercise controlled     Family History  Problem Relation Age of Onset  . Cancer Other   . Hypertension Other   . Hyperlipidemia Other   . Healthy Mother   . Pneumonia Father      Current Outpatient Medications:  .  valsartan (DIOVAN) 160 MG tablet, TAKE 1 TABLET BY MOUTH EVERY DAY, Disp: 90 tablet, Rfl: 2   No Known Allergies   Review of Systems  Constitutional: Negative.   Respiratory: Negative.   Cardiovascular: Negative.   Gastrointestinal: Negative.   Musculoskeletal: Positive for neck pain.  Neurological: Negative.   Psychiatric/Behavioral: Negative.      Today's Vitals   07/09/18 0844  BP: (!) 144/86  Pulse: 68  Temp: 98.7 F (37.1 C)  TempSrc: Oral  Weight: 196 lb 12.8 oz (89.3 kg)  Height: _0  (1.575 m)   Body mass index is 36 kg/m.   Objective:  Physical Exam Vitals signs and nursing note reviewed.  Constitutional:      Appearance: Normal appearance. She is obese.  HENT:     Head: Normocephalic and atraumatic.  Cardiovascular:     Rate and Rhythm: Normal rate and regular rhythm.     Heart sounds: Normal heart sounds.  Pulmonary:     Effort: Pulmonary effort is normal.     Breath  sounds: Normal breath sounds.  Skin:    General: Skin is warm.  Neurological:     General: No focal deficit present.     Mental Status: She is alert.  Psychiatric:        Mood and Affect: Mood normal.         Assessment And Plan:     1. Essential hypertension, benign  Uncontrolled. She was given information on DASH eating plan. She is encouraged to avoid adding salt to her foods and to avoid packaged foods. She will rto in 3 months for re-evaluation.   2. Prediabetes  HER A1C HAS BEEN ELEVATED IN THE PAST. I WILL CHECK AN A1C, BMET TODAY. SHE WAS ENCOURAGED TO AVOID SUGARY BEVERAGES AND PROCESSED FOODS INCLUDNG BREADS, RICE AND PASTA.  - CMP14+EGFR - Hemoglobin A1c - Lipid Profile  3. Cervical paraspinal muscle spasm  She is advised to start magnesium 250-457m nightly. She will continue with acupuncture therapy.   4. Class 2 severe obesity due to excess calories with serious comorbidity and body mass index (BMI) of 36.0 to 36.9 in adult ([facility]  She is encouraged to strive for BMI less than 30 to decrease cardiac risk. She is willing to resume her regular exercise regimen and cut back on her portion sizes.   RBailey Mech  Shawnie Dapper, MD

## 2018-07-09 NOTE — Patient Instructions (Signed)
DASH Eating Plan  DASH stands for "Dietary Approaches to Stop Hypertension." The DASH eating plan is a healthy eating plan that has been shown to reduce high blood pressure (hypertension). It may also reduce your risk for type 2 diabetes, heart disease, and stroke. The DASH eating plan may also help with weight loss.  What are tips for following this plan?    General guidelines   Avoid eating more than 2,300 mg (milligrams) of salt (sodium) a day. If you have hypertension, you may need to reduce your sodium intake to 1,500 mg a day.   Limit alcohol intake to no more than 1 drink a day for nonpregnant women and 2 drinks a day for men. One drink equals 12 oz of beer, 5 oz of wine, or 1 oz of hard liquor.   Work with your health care provider to maintain a healthy body weight or to lose weight. Ask what an ideal weight is for you.   Get at least 30 minutes of exercise that causes your heart to beat faster (aerobic exercise) most days of the week. Activities may include walking, swimming, or biking.   Work with your health care provider or diet and nutrition specialist (dietitian) to adjust your eating plan to your individual calorie needs.  Reading food labels     Check food labels for the amount of sodium per serving. Choose foods with less than 5 percent of the Daily Value of sodium. Generally, foods with less than 300 mg of sodium per serving fit into this eating plan.   To find whole grains, look for the word "whole" as the first word in the ingredient list.  Shopping   Buy products labeled as "low-sodium" or "no salt added."   Buy fresh foods. Avoid canned foods and premade or frozen meals.  Cooking   Avoid adding salt when cooking. Use salt-free seasonings or herbs instead of table salt or sea salt. Check with your health care provider or pharmacist before using salt substitutes.   Do not fry foods. Cook foods using healthy methods such as baking, boiling, grilling, and broiling instead.   Cook with  heart-healthy oils, such as olive, canola, soybean, or sunflower oil.  Meal planning   Eat a balanced diet that includes:  ? 5 or more servings of fruits and vegetables each day. At each meal, try to fill half of your plate with fruits and vegetables.  ? Up to 6-8 servings of whole grains each day.  ? Less than 6 oz of lean meat, poultry, or fish each day. A 3-oz serving of meat is about the same size as a deck of cards. One egg equals 1 oz.  ? 2 servings of low-fat dairy each day.  ? A serving of nuts, seeds, or beans 5 times each week.  ? Heart-healthy fats. Healthy fats called Omega-3 fatty acids are found in foods such as flaxseeds and coldwater fish, like sardines, salmon, and mackerel.   Limit how much you eat of the following:  ? Canned or prepackaged foods.  ? Food that is high in trans fat, such as fried foods.  ? Food that is high in saturated fat, such as fatty meat.  ? Sweets, desserts, sugary drinks, and other foods with added sugar.  ? Full-fat dairy products.   Do not salt foods before eating.   Try to eat at least 2 vegetarian meals each week.   Eat more home-cooked food and less restaurant, buffet, and fast food.     When eating at a restaurant, ask that your food be prepared with less salt or no salt, if possible.  What foods are recommended?  The items listed may not be a complete list. Talk with your dietitian about what dietary choices are best for you.  Grains  Whole-grain or whole-wheat bread. Whole-grain or whole-wheat pasta. Brown rice. Oatmeal. Quinoa. Bulgur. Whole-grain and low-sodium cereals. Pita bread. Low-fat, low-sodium crackers. Whole-wheat flour tortillas.  Vegetables  Fresh or frozen vegetables (raw, steamed, roasted, or grilled). Low-sodium or reduced-sodium tomato and vegetable juice. Low-sodium or reduced-sodium tomato sauce and tomato paste. Low-sodium or reduced-sodium canned vegetables.  Fruits  All fresh, dried, or frozen fruit. Canned fruit in natural juice (without  added sugar).  Meat and other protein foods  Skinless chicken or turkey. Ground chicken or turkey. Pork with fat trimmed off. Fish and seafood. Egg whites. Dried beans, peas, or lentils. Unsalted nuts, nut butters, and seeds. Unsalted canned beans. Lean cuts of beef with fat trimmed off. Low-sodium, lean deli meat.  Dairy  Low-fat (1%) or fat-free (skim) milk. Fat-free, low-fat, or reduced-fat cheeses. Nonfat, low-sodium ricotta or cottage cheese. Low-fat or nonfat yogurt. Low-fat, low-sodium cheese.  Fats and oils  Soft margarine without trans fats. Vegetable oil. Low-fat, reduced-fat, or light mayonnaise and salad dressings (reduced-sodium). Canola, safflower, olive, soybean, and sunflower oils. Avocado.  Seasoning and other foods  Herbs. Spices. Seasoning mixes without salt. Unsalted popcorn and pretzels. Fat-free sweets.  What foods are not recommended?  The items listed may not be a complete list. Talk with your dietitian about what dietary choices are best for you.  Grains  Baked goods made with fat, such as croissants, muffins, or some breads. Dry pasta or rice meal packs.  Vegetables  Creamed or fried vegetables. Vegetables in a cheese sauce. Regular canned vegetables (not low-sodium or reduced-sodium). Regular canned tomato sauce and paste (not low-sodium or reduced-sodium). Regular tomato and vegetable juice (not low-sodium or reduced-sodium). Pickles. Olives.  Fruits  Canned fruit in a light or heavy syrup. Fried fruit. Fruit in cream or butter sauce.  Meat and other protein foods  Fatty cuts of meat. Ribs. Fried meat. Bacon. Sausage. Bologna and other processed lunch meats. Salami. Fatback. Hotdogs. Bratwurst. Salted nuts and seeds. Canned beans with added salt. Canned or smoked fish. Whole eggs or egg yolks. Chicken or turkey with skin.  Dairy  Whole or 2% milk, cream, and half-and-half. Whole or full-fat cream cheese. Whole-fat or sweetened yogurt. Full-fat cheese. Nondairy creamers. Whipped toppings.  Processed cheese and cheese spreads.  Fats and oils  Butter. Stick margarine. Lard. Shortening. Ghee. Bacon fat. Tropical oils, such as coconut, palm kernel, or palm oil.  Seasoning and other foods  Salted popcorn and pretzels. Onion salt, garlic salt, seasoned salt, table salt, and sea salt. Worcestershire sauce. Tartar sauce. Barbecue sauce. Teriyaki sauce. Soy sauce, including reduced-sodium. Steak sauce. Canned and packaged gravies. Fish sauce. Oyster sauce. Cocktail sauce. Horseradish that you find on the shelf. Ketchup. Mustard. Meat flavorings and tenderizers. Bouillon cubes. Hot sauce and Tabasco sauce. Premade or packaged marinades. Premade or packaged taco seasonings. Relishes. Regular salad dressings.  Where to find more information:   National Heart, Lung, and Blood Institute: www.nhlbi.nih.gov   American Heart Association: www.heart.org  Summary   The DASH eating plan is a healthy eating plan that has been shown to reduce high blood pressure (hypertension). It may also reduce your risk for type 2 diabetes, heart disease, and stroke.   With the   DASH eating plan, you should limit salt (sodium) intake to 2,300 mg a day. If you have hypertension, you may need to reduce your sodium intake to 1,500 mg a day.   When on the DASH eating plan, aim to eat more fresh fruits and vegetables, whole grains, lean proteins, low-fat dairy, and heart-healthy fats.   Work with your health care provider or diet and nutrition specialist (dietitian) to adjust your eating plan to your individual calorie needs.  This information is not intended to replace advice given to you by your health care provider. Make sure you discuss any questions you have with your health care provider.  Document Released: 06/09/2011 Document Revised: 06/13/2016 Document Reviewed: 06/13/2016  Elsevier Interactive Patient Education  2019 [employer]

## 2018-07-10 NOTE — Progress Notes (Signed)
Here are your lab results:  Your kidney function is norp14mal. Your liver enzymes are elevated. If you are taking over the counter pain meds, please stop. It is also important that you resume your regular exercise regimen.   Your hba1c is 6.0, this is in prediabetes range. Your LDL, bad cholesterol, is 108. Ideally, this should be less than 100. You are close! Be sure to avoid fried foods and exercise no less than 150 minutes per week.   Please let me know if you have any questions.   Sincerely,    Rihaan Barrack N. [facility], MD

## 2018-08-03 ENCOUNTER — Other Ambulatory Visit: Payer: Self-pay | Admitting: Internal Medicine

## 2018-09-03 ENCOUNTER — Encounter: Payer: Self-pay | Admitting: Internal Medicine

## 2018-09-03 ENCOUNTER — Ambulatory Visit: Payer: BC Managed Care – PPO | Admitting: Internal Medicine

## 2018-09-03 VITALS — BP 124/80 | HR 66 | Temp 97.6°F | Ht 62.0 in | Wt 191.0 lb

## 2018-09-03 DIAGNOSIS — R229 Localized swelling, mass and lump, unspecified: Secondary | ICD-10-CM

## 2018-09-03 DIAGNOSIS — M542 Cervicalgia: Secondary | ICD-10-CM | POA: Diagnosis not present

## 2018-09-03 MED ORDER — CYCLOBENZAPRINE HCL 10 MG PO TABS: 10.0000 mg | ORAL_TABLET | Freq: Three times a day (TID) | ORAL | 0 refills | Status: DC | PRN

## 2018-09-03 MED ORDER — CYCLOBENZAPRINE HCL 10 MG PO TABS: ORAL_TABLET | ORAL | 0 refills | Status: DC

## 2018-09-03 NOTE — Progress Notes (Signed)
l  Subjective:     Patient ID: Nichole George , female    DOB: [birth year] , 59 y.o.   MRN: [id number]   Chief Complaint  Patient presents with  . Swollen neck    HPI  Neck Pain   This is a new problem. The current episode started 1 to 4 weeks ago. The problem occurs daily. The problem has been gradually worsening. The pain is associated with an unknown factor. The pain is present in the right side. The quality of the pain is described as aching. The pain is at a severity of 5/10. The pain is moderate. The symptoms are aggravated by position.   She reports massage therapy helps for a short time. She has been to the chiropractor as well. She has tried applying Vicks which helps temporarily as well.   Past Medical History:  Diagnosis Date  . Arthritis    left ankle and right big toe and both knees  . Cervical dysplasia, moderate   . Hyperlipidemia   . Hypertension   . Type 2 diabetes mellitus (HCC)    diet  and exercise controlled     Family History  Problem Relation Age of Onset  . Cancer Other   . Hypertension Other   . Hyperlipidemia Other   . Healthy Mother   . Pneumonia Father      Current Outpatient Medications:  .  fluticasone (FLONASE) 50 MCG/ACT nasal spray, SPRAY ONE SPRAY INTO EACH NOSTRIL DAILY, Disp: 48 g, Rfl: 5 .  valsartan (DIOVAN) 160 MG tablet, TAKE 1 TABLET BY MOUTH EVERY DAY, Disp: 90 tablet, Rfl: 2 .  cyclobenzaprine (FLEXERIL) 10 MG tablet, pls disregard rx with tid dosing One-half - one tab po qpm prn, do not take later than 7-8 pm, Disp: 30 tablet, Rfl: 0   No Known Allergies   Review of Systems  Constitutional: Negative.   Respiratory: Negative.   Cardiovascular: Negative.   Gastrointestinal: Negative.   Musculoskeletal: Positive for myalgias and neck pain.  Neurological: Negative.   Psychiatric/Behavioral: Negative.      Today's Vitals   09/03/18 0841  BP: 124/80  Pulse: 66  Temp: 97.6 F (36.4 C)  TempSrc: Oral  Weight: 191 lb (86.6  kg)  Height: 5\' 2"  (1.575 m)  PainSc: 6   PainLoc: Neck   Body mass index is 34.93 kg/m.   Objective:  Physical Exam Constitutional:      Appearance: Normal appearance.  HENT:     Head: Normocephalic and atraumatic.  Neck:     Musculoskeletal: Muscular tenderness present.     Comments: Tenderness to r trap. Mm, no overlying erythema. There is some discomfort with movement of neck.  Skin:    General: Skin is warm.  Neurological:     General: No focal deficit present.     Mental Status: She is alert.  Psychiatric:        Mood and Affect: Mood normal.         Assessment And Plan:     1. Cervicalgia  I will send in rx cyclobenzaprine 10mg  1/2-1 tab nightly as needed.  She will continue to apply mentholated cream to affected area as needed. I will also call in rx compounded pain cream for her to apply to affected area as needed. She has also been advised to start magnesium 200mg  twice daily.   - Ambulatory referral to Physical Therapy  2. Soft tissue swelling  I will check ultrasound of neck if this does not  resolve with manual manipulation/muscle relaxers.   Maximino Greenland, MD

## 2018-09-04 ENCOUNTER — Encounter: Payer: Self-pay | Admitting: Internal Medicine

## 2018-10-01 ENCOUNTER — Encounter: Payer: Self-pay | Admitting: Internal Medicine

## 2018-10-08 ENCOUNTER — Ambulatory Visit: Payer: BC Managed Care – PPO | Admitting: Internal Medicine

## 2018-11-29 ENCOUNTER — Other Ambulatory Visit: Payer: Self-pay | Admitting: Internal Medicine

## 2018-11-29 DIAGNOSIS — Z1231 Encounter for screening mammogram for malignant neoplasm of breast: Secondary | ICD-10-CM

## 2019-01-15 ENCOUNTER — Ambulatory Visit
Admission: RE | Admit: 2019-01-15 | Discharge: 2019-01-15 | Disposition: A | Discharge status: Home / Self Care | Payer: BC Managed Care – PPO | Source: Ambulatory Visit | Attending: Internal Medicine | Admitting: Internal Medicine

## 2019-01-15 DIAGNOSIS — Z1231 Encounter for screening mammogram for malignant neoplasm of breast: Secondary | ICD-10-CM

## 2019-01-15 LAB — HM MAMMOGRAPHY

## 2019-01-16 ENCOUNTER — Encounter: Payer: Self-pay | Admitting: Internal Medicine

## 2019-01-20 ENCOUNTER — Encounter: Payer: Self-pay | Admitting: Internal Medicine

## 2019-01-21 ENCOUNTER — Encounter: Payer: Self-pay | Admitting: Internal Medicine

## 2019-01-21 ENCOUNTER — Ambulatory Visit: Payer: BC Managed Care – PPO | Admitting: Internal Medicine

## 2019-01-21 ENCOUNTER — Other Ambulatory Visit: Payer: Self-pay

## 2019-01-21 VITALS — BP 118/76 | HR 68 | Temp 98.4°F | Ht 65.4 in | Wt 200.8 lb

## 2019-01-21 DIAGNOSIS — I10 Essential (primary) hypertension: Secondary | ICD-10-CM | POA: Diagnosis not present

## 2019-01-21 DIAGNOSIS — Z0000 Encounter for general adult medical examination without abnormal findings: Secondary | ICD-10-CM

## 2019-01-21 LAB — LIPID PANEL
Chol/HDL Ratio: 3 ratio (ref 0.0–4.4)
Cholesterol, Total: 210 mg/dL — ABNORMAL HIGH (ref 100–199)
HDL: 71 mg/dL (ref >39)
LDL Calculated: 121 mg/dL — ABNORMAL HIGH (ref 0–99)
Triglycerides: 92 mg/dL (ref 0–149)
VLDL Cholesterol Cal: 18 mg/dL (ref 5–40)

## 2019-01-21 LAB — POCT URINALYSIS DIPSTICK
Bilirubin, UA: NEGATIVE
Blood, UA: NEGATIVE
Glucose, UA: NEGATIVE
Ketones, UA: NEGATIVE
Nitrite, UA: NEGATIVE
Protein, UA: NEGATIVE
Spec Grav, UA: 1.02 (ref 1.010–1.025)
Urobilinogen, UA: 0.2 E.U./dL
pH, UA: 5.5 (ref 5.0–8.0)

## 2019-01-21 LAB — CBC
Hematocrit: 44.2 % (ref 34.0–46.6)
Hemoglobin: 14.2 g/dL (ref 11.1–15.9)
MCH: 30.7 pg (ref 26.6–33.0)
MCHC: 32.1 g/dL (ref 31.5–35.7)
MCV: 96 fL (ref 79–97)
Platelets: 187 10*3/uL (ref 150–450)
RBC: 4.62 x10E6/uL (ref 3.77–5.28)
RDW: 12.6 % (ref 11.7–15.4)
WBC: 6 10*3/uL (ref 3.4–10.8)

## 2019-01-21 LAB — CMP14+EGFR
ALT: 59 IU/L — ABNORMAL HIGH (ref 0–32)
AST: 42 IU/L — ABNORMAL HIGH (ref 0–40)
Albumin/Globulin Ratio: 2 (ref 1.2–2.2)
Albumin: 4.5 g/dL (ref 3.8–4.9)
Alkaline Phosphatase: 95 IU/L (ref 39–117)
BUN/Creatinine Ratio: 13 (ref 9–23)
BUN: 11 mg/dL (ref 6–24)
Bilirubin Total: 0.5 mg/dL (ref 0.0–1.2)
CO2: 23 mmol/L (ref 20–29)
Calcium: 9.2 mg/dL (ref 8.7–10.2)
Chloride: 102 mmol/L (ref 96–106)
Creatinine, Ser: 0.87 mg/dL (ref 0.57–1.00)
GFR calc Af Amer: 85 mL/min/{1.73_m2} (ref >59)
GFR calc non Af Amer: 74 mL/min/{1.73_m2} (ref >59)
Globulin, Total: 2.2 g/dL (ref 1.5–4.5)
Glucose: 107 mg/dL — ABNORMAL HIGH (ref 65–99)
Potassium: 4.4 mmol/L (ref 3.5–5.2)
Sodium: 140 mmol/L (ref 134–144)
Total Protein: 6.7 g/dL (ref 6.0–8.5)

## 2019-01-21 LAB — HEMOGLOBIN A1C
Est. average glucose Bld gHb Est-mCnc: 111 mg/dL
Hgb A1c MFr Bld: 5.5 % (ref 4.8–5.6)

## 2019-01-21 LAB — POCT UA - MICROALBUMIN
Albumin/Creatinine Ratio, Urine, POC: <30
Creatinine, POC: 100 mg/dL
Microalbumin Ur, POC: 10 mg/L

## 2019-01-21 NOTE — Patient Instructions (Addendum)
CALM-- magnesium supplement, use nightly  Health Maintenance, Female Adopting a healthy lifestyle and getting preventive care are important in promoting health and wellness. Ask your health care provider about:  The right schedule for you to have regular tests and exams.  Things you can do on your own to prevent diseases and keep yourself healthy. What should I know about diet, weight, and exercise? Eat a healthy diet   Eat a diet that includes plenty of vegetables, fruits, low-fat dairy products, and lean protein.  Do not eat a lot of foods that are high in solid fats, added sugars, or sodium. Maintain a healthy weight Body mass index (BMI) is used to identify weight problems. It estimates body fat based on height and weight. Your health care provider can help determine your BMI and help you achieve or maintain a healthy weight. Get regular exercise Get regular exercise. This is one of the most important things you can do for your health. Most adults should:  Exercise for at least 150 minutes each week. The exercise should increase your heart rate and make you sweat (moderate-intensity exercise).  Do strengthening exercises at least twice a week. This is in addition to the moderate-intensity exercise.  Spend less time sitting. Even light physical activity can be beneficial. Watch cholesterol and blood lipids Have your blood tested for lipids and cholesterol at 59 years of age, then have this test every 5 years. Have your cholesterol levels checked more often if:  Your lipid or cholesterol levels are high.  You are older than 59 years of age.  You are at high risk for heart disease. What should I know about cancer screening? Depending on your health history and family history, you may need to have cancer screening at various ages. This may include screening for:  Breast cancer.  Cervical cancer.  Colorectal cancer.  Skin cancer.  Lung cancer. What should I know about  heart disease, diabetes, and high blood pressure? Blood pressure and heart disease  High blood pressure causes heart disease and increases the risk of stroke. This is more likely to develop in people who have high blood pressure readings, are of African descent, or are overweight.  Have your blood pressure checked: ? Every 3-5 years if you are 59-67 years of age. ? Every year if you are 59 years old or older. Diabetes Have regular diabetes screenings. This checks your fasting blood sugar level. Have the screening done:  Once every three years after age 13 if you are at a normal weight and have a low risk for diabetes.  More often and at a younger age if you are overweight or have a high risk for diabetes. What should I know about preventing infection? Hepatitis B If you have a higher risk for hepatitis B, you should be screened for this virus. Talk with your health care provider to find out if you are at risk for hepatitis B infection. Hepatitis C Testing is recommended for:  Everyone born from [birth year] through 1966.  Anyone with known risk factors for hepatitis C. Sexually transmitted infections (STIs)  Get screened for STIs, including gonorrhea and chlamydia, if: ? You are sexually active and are younger than 59 years of age. ? You are older than 59 years of age and your health care provider tells you that you are at risk for this type of infection. ? Your sexual activity has changed since you were last screened, and you are at increased risk for chlamydia or gonorrhea.  Ask your health care provider if you are at risk.  Ask your health care provider about whether you are at high risk for HIV. Your health care provider may recommend a prescription medicine to help prevent HIV infection. If you choose to take medicine to prevent HIV, you should first get tested for HIV. You should then be tested every 3 months for as long as you are taking the medicine. Pregnancy  If you are about to  stop having your period (premenopausal) and you may become pregnant, seek counseling before you get pregnant.  Take 400 to 800 micrograms (mcg) of folic acid every day if you become pregnant.  Ask for birth control (contraception) if you want to prevent pregnancy. Osteoporosis and menopause Osteoporosis is a disease in which the bones lose minerals and strength with aging. This can result in bone fractures. If you are 46 years old or older, or if you are at risk for osteoporosis and fractures, ask your health care provider if you should:  Be screened for bone loss.  Take a calcium or vitamin D supplement to lower your risk of fractures.  Be given hormone replacement therapy (HRT) to treat symptoms of menopause. Follow these instructions at home: Lifestyle  Do not use any products that contain nicotine or tobacco, such as cigarettes, e-cigarettes, and chewing tobacco. If you need help quitting, ask your health care provider.  Do not use street drugs.  Do not share needles.  Ask your health care provider for help if you need support or information about quitting drugs. Alcohol use  Do not drink alcohol if: ? Your health care provider tells you not to drink. ? You are pregnant, may be pregnant, or are planning to become pregnant.  If you drink alcohol: ? Limit how much you use to 0-1 drink a day. ? Limit intake if you are breastfeeding.  Be aware of how much alcohol is in your drink. In the [place] one drink equals one 12 oz bottle of beer (355 mL), one 5 oz glass of wine (148 mL), or one 1 oz glass of hard liquor (44 mL). General instructions  Schedule regular health, dental, and eye exams.  Stay current with your vaccines.  Tell your health care provider if: ? You often feel depressed. ? You have ever been abused or do not feel safe at home. Summary  Adopting a healthy lifestyle and getting preventive care are important in promoting health and wellness.  Follow your  health care provider's instructions about healthy diet, exercising, and getting tested or screened for diseases.  Follow your health care provider's instructions on monitoring your cholesterol and blood pressure. This information is not intended to replace advice given to you by your health care provider. Make sure you discuss any questions you have with your health care provider. Document Released: 01/03/2011 Document Revised: 06/13/2018 Document Reviewed: 06/13/2018 Elsevier Patient Education  2020 [employer].

## 2019-01-21 NOTE — Progress Notes (Signed)
Subjective:     Patient ID: Nichole George , female    DOB: [birth year] , 58 y.o.   MRN: [id number]   Chief Complaint  Patient presents with  . Annual Exam  . Hypertension    HPI  She is here today for a full physical examination. She is followed by Dr. Mancel George for her GYN care. She has no specific concerns or complaints at this time. She admits she is not exercising as much as she should.   Hypertension This is a chronic problem. The current episode started more than 1 year ago. The problem is controlled. Pertinent negatives include no blurred vision, chest pain, palpitations or shortness of breath. Risk factors for coronary artery disease include obesity and post-menopausal state. Past treatments include angiotensin blockers. The current treatment provides moderate improvement. Compliance problems include exercise.      Past Medical History:  Diagnosis Date  . Arthritis    left ankle and right big toe and both knees  . Cervical dysplasia, moderate   . Hyperlipidemia   . Hypertension   . Type 2 diabetes mellitus (HCC)    diet  and exercise controlled     Family History  Problem Relation Age of Onset  . Cancer Other   . Hypertension Other   . Hyperlipidemia Other   . Healthy Mother   . Pneumonia Father   . Breast cancer Sister 20     Current Outpatient Medications:  .  Ascorbic Acid (VITAMIN C) POWD, Take by mouth. 1/2 tsp daily, Disp: , Rfl:  .  BLACK ELDERBERRY,BERRY-FLOWER, PO, Take by mouth. 2 tsp daily, Disp: , Rfl:  .  Cholecalciferol (VITAMIN D3) 50 MCG (2000 UT) capsule, Take 2,000 Units by mouth daily., Disp: , Rfl:  .  cyclobenzaprine (FLEXERIL) 10 MG tablet, pls disregard rx with tid dosing One-half - one tab po qpm prn, do not take later than 7-8 pm, Disp: 30 tablet, Rfl: 0 .  fluticasone (FLONASE) 50 MCG/ACT nasal spray, SPRAY ONE SPRAY INTO EACH NOSTRIL DAILY, Disp: 48 g, Rfl: 5 .  loratadine (CLARITIN) 10 MG tablet, Take 10 mg by mouth daily., Disp: , Rfl:   .  valsartan (DIOVAN) 160 MG tablet, TAKE 1 TABLET BY MOUTH EVERY DAY, Disp: 90 tablet, Rfl: 2   No Known Allergies   Review of Systems  Constitutional: Negative.   HENT: Negative.   Eyes: Negative.  Negative for blurred vision.  Respiratory: Negative.  Negative for shortness of breath.   Cardiovascular: Negative.  Negative for chest pain and palpitations.  Endocrine: Negative.   Genitourinary: Negative.   Musculoskeletal: Negative.   Skin: Negative.   Allergic/Immunologic: Negative.   Neurological: Negative.   Hematological: Negative.   Psychiatric/Behavioral: Negative.      Today's Vitals   01/21/19 0858  BP: 118/76  Pulse: 68  Temp: 98.4 F (36.9 C)  TempSrc: Oral  Weight: 200 lb 12.8 oz (91.1 kg)  Height: 5' 5.4" (1.661 m)   Body mass index is 33.01 kg/m.   Objective:  Physical Exam Vitals signs and nursing note reviewed.  Constitutional:      Appearance: Normal appearance.  HENT:     Head: Normocephalic and atraumatic.     Right Ear: Tympanic membrane, ear canal and external ear normal.     Left Ear: Tympanic membrane, ear canal and external ear normal.     Nose: Nose normal.     Mouth/Throat:     Mouth: Mucous membranes are moist.  Pharynx: Oropharynx is clear.  Eyes:     Extraocular Movements: Extraocular movements intact.     Conjunctiva/sclera: Conjunctivae normal.     Pupils: Pupils are equal, round, and reactive to light.  Neck:     Musculoskeletal: Normal range of motion and neck supple.  Cardiovascular:     Rate and Rhythm: Normal rate and regular rhythm.     Pulses: Normal pulses.     Heart sounds: Normal heart sounds.  Pulmonary:     Effort: Pulmonary effort is normal.     Breath sounds: Normal breath sounds.  Chest:     Breasts: Tanner Score is 5.        Right: Normal. No swelling, bleeding, inverted nipple, mass, nipple discharge or skin change.        Left: Normal. No swelling, bleeding, inverted nipple, mass, nipple discharge or  skin change.  Abdominal:     General: Abdomen is flat. Bowel sounds are normal.     Palpations: Abdomen is soft.  Genitourinary:    Comments: deferred Musculoskeletal: Normal range of motion.  Skin:    General: Skin is warm and dry.  Neurological:     General: No focal deficit present.     Mental Status: She is alert and oriented to person, place, and time.  Psychiatric:        Mood and Affect: Mood normal.        Behavior: Behavior normal.         Assessment And Plan:     1. Routine general medical examination at a health care facility  A full exam was performed. Importance of monthly self breast exams was discussed with the patient. PATIENT HAS BEEN ADVISED TO GET 30-45 MINUTES REGULAR EXERCISE NO LESS THAN FOUR TO FIVE DAYS PER WEEK - BOTH WEIGHTBEARING EXERCISES AND AEROBIC ARE RECOMMENDED.  SHE IS ADVISED TO FOLLOW A HEALTHY DIET WITH AT LEAST SIX FRUITS/VEGGIES PER DAY, DECREASE INTAKE OF RED MEAT, AND TO INCREASE FISH INTAKE TO TWO DAYS PER WEEK.  MEATS/FISH SHOULD NOT BE FRIED, BAKED OR BROILED IS PREFERABLE.  I SUGGEST WEARING SPF 50 SUNSCREEN ON EXPOSED PARTS AND ESPECIALLY WHEN IN THE DIRECT SUNLIGHT FOR AN EXTENDED PERIOD OF TIME.  PLEASE AVOID FAST FOOD RESTAURANTS AND INCREASE YOUR WATER INTAKE.  - CMP14+EGFR - CBC - Lipid panel - Hemoglobin A1c  2. Essential hypertension, benign  Well controlled. She will continue with current meds. EKG performed, no acute changes noted. She is encouraged to incorporate more exercise into her daily routine. She will rto in six months for re-evaluation.   - EKG 12-Lead        Maximino Greenland, MD    THE PATIENT IS ENCOURAGED TO PRACTICE SOCIAL DISTANCING DUE TO THE COVID-19 PANDEMIC.

## 2019-01-28 ENCOUNTER — Encounter: Payer: Self-pay | Admitting: Internal Medicine

## 2019-02-04 ENCOUNTER — Other Ambulatory Visit: Payer: Self-pay | Admitting: Internal Medicine

## 2019-02-04 ENCOUNTER — Encounter: Payer: Self-pay | Admitting: Internal Medicine

## 2019-02-04 DIAGNOSIS — R229 Localized swelling, mass and lump, unspecified: Secondary | ICD-10-CM

## 2019-02-07 ENCOUNTER — Encounter: Payer: Self-pay | Admitting: Internal Medicine

## 2019-02-08 ENCOUNTER — Encounter: Payer: Self-pay | Admitting: Internal Medicine

## 2019-02-12 ENCOUNTER — Other Ambulatory Visit: Payer: BC Managed Care – PPO

## 2019-02-13 ENCOUNTER — Ambulatory Visit
Admission: RE | Admit: 2019-02-13 | Discharge: 2019-02-13 | Disposition: A | Discharge status: Home / Self Care | Payer: BC Managed Care – PPO | Source: Ambulatory Visit | Attending: Internal Medicine | Admitting: Internal Medicine

## 2019-02-13 DIAGNOSIS — R229 Localized swelling, mass and lump, unspecified: Secondary | ICD-10-CM

## 2019-02-14 ENCOUNTER — Encounter: Payer: Self-pay | Admitting: Internal Medicine

## 2019-03-07 ENCOUNTER — Encounter: Payer: Self-pay | Admitting: Internal Medicine

## 2019-03-07 ENCOUNTER — Telehealth: Payer: Self-pay

## 2019-03-07 NOTE — Telephone Encounter (Signed)
I advised patient to f/u with her dentist or go to the urgent care if the swelling and pain continues. YRL,RMA

## 2019-04-04 ENCOUNTER — Encounter: Payer: Self-pay | Admitting: Internal Medicine

## 2019-04-29 ENCOUNTER — Other Ambulatory Visit: Payer: Self-pay | Admitting: Internal Medicine

## 2019-05-14 ENCOUNTER — Encounter: Payer: Self-pay | Admitting: Internal Medicine

## 2019-05-22 ENCOUNTER — Other Ambulatory Visit: Payer: Self-pay

## 2019-05-22 DIAGNOSIS — Z20822 Contact with and (suspected) exposure to covid-19: Secondary | ICD-10-CM

## 2019-05-24 ENCOUNTER — Encounter: Payer: Self-pay | Admitting: Internal Medicine

## 2019-05-24 LAB — SPECIMEN STATUS REPORT

## 2019-05-24 LAB — NOVEL CORONAVIRUS, NAA: SARS-CoV-2, NAA: NOT DETECTED

## 2019-07-24 ENCOUNTER — Other Ambulatory Visit: Payer: Self-pay

## 2019-07-24 ENCOUNTER — Ambulatory Visit: Payer: BC Managed Care – PPO | Admitting: Internal Medicine

## 2019-07-24 ENCOUNTER — Encounter: Payer: Self-pay | Admitting: Internal Medicine

## 2019-07-24 VITALS — BP 126/82 | HR 95 | Temp 97.9°F | Ht 65.4 in | Wt 208.4 lb

## 2019-07-24 DIAGNOSIS — Z6834 Body mass index (BMI) 34.0-34.9, adult: Secondary | ICD-10-CM

## 2019-07-24 DIAGNOSIS — I10 Essential (primary) hypertension: Secondary | ICD-10-CM

## 2019-07-24 DIAGNOSIS — R7309 Other abnormal glucose: Secondary | ICD-10-CM | POA: Diagnosis not present

## 2019-07-24 DIAGNOSIS — E559 Vitamin D deficiency, unspecified: Secondary | ICD-10-CM | POA: Diagnosis not present

## 2019-07-24 DIAGNOSIS — E6609 Other obesity due to excess calories: Secondary | ICD-10-CM

## 2019-07-24 NOTE — Progress Notes (Signed)
This visit occurred during the SARS-CoV-2 public health emergency.  Safety protocols were in place, including screening questions prior to the visit, additional usage of staff PPE, and extensive cleaning of exam room while observing appropriate contact time as indicated for disinfecting solutions.  Subjective:     Patient ID: Nichole George , female    DOB: [birth year] , 59 y.o.   MRN: [id number]   Chief Complaint  Patient presents with  . Hypertension  . Immunizations    pneumonia    HPI  She is here today for a bp check. She reports compliance with meds. Reports feeling great!   Hypertension This is a chronic problem. The current episode started more than 1 year ago. The problem has been gradually improving since onset. The problem is uncontrolled. Risk factors for coronary artery disease include obesity and post-menopausal state.     Past Medical History:  Diagnosis Date  . Arthritis    left ankle and right big toe and both knees  . Cervical dysplasia, moderate   . Hyperlipidemia   . Hypertension   . Type 2 diabetes mellitus (HCC)    diet  and exercise controlled     Family History  Problem Relation Age of Onset  . Cancer Other   . Hypertension Other   . Hyperlipidemia Other   . Healthy Mother   . Pneumonia Father   . Breast cancer Sister 72     Current Outpatient Medications:  .  Ascorbic Acid (VITAMIN C) POWD, Take by mouth. 1/2 tsp daily, Disp: , Rfl:  .  Cholecalciferol (VITAMIN D3) 50 MCG (2000 UT) capsule, Take 1,000 Units by mouth daily. , Disp: , Rfl:  .  fluticasone (FLONASE) 50 MCG/ACT nasal spray, SPRAY ONE SPRAY INTO EACH NOSTRIL DAILY, Disp: 48 g, Rfl: 5 .  loratadine (CLARITIN) 10 MG tablet, Take 10 mg by mouth daily., Disp: , Rfl:  .  valsartan (DIOVAN) 160 MG tablet, TAKE 1 TABLET BY MOUTH EVERY DAY, Disp: 90 tablet, Rfl: 2 .  BLACK ELDERBERRY,BERRY-FLOWER, PO, Take by mouth. 2 tsp daily, Disp: , Rfl:    No Known Allergies   Review of Systems   Constitutional: Negative.   Respiratory: Negative.   Cardiovascular: Negative.   Gastrointestinal: Negative.   Neurological: Negative.   Psychiatric/Behavioral: Negative.      Today's Vitals   07/24/19 0854  BP: 126/82  Pulse: 95  Temp: 97.9 F (36.6 C)  TempSrc: Oral  Weight: 208 lb 6.4 oz (94.5 kg)  Height: 5' 5.4" (1.661 m)   Body mass index is 34.26 kg/m.   Objective:  Physical Exam Vitals and nursing note reviewed.  Constitutional:      Appearance: Normal appearance.  HENT:     Head: Normocephalic and atraumatic.  Cardiovascular:     Rate and Rhythm: Normal rate and regular rhythm.     Heart sounds: Normal heart sounds.  Pulmonary:     Effort: Pulmonary effort is normal.     Breath sounds: Normal breath sounds.  Skin:    General: Skin is warm.  Neurological:     General: No focal deficit present.     Mental Status: She is alert.  Psychiatric:        Mood and Affect: Mood normal.        Behavior: Behavior normal.         Assessment And Plan:     1. Essential hypertension, benign  Chronic, well controlled. She will continue with current meds. I will  check her renal function today.   - CMP14+EGFR - Magnesium  2. Other abnormal glucose  I WILL CHECK A VIT D LEVEL AND SUPPLEMENT AS NEEDED.  ALSO ENCOURAGED TO SPEND 15 MINUTES IN THE SUN DAILY.  - Hemoglobin A1c  3. Vitamin D deficiency disease  I WILL CHECK A VIT D LEVEL AND SUPPLEMENT AS NEEDED.  ALSO ENCOURAGED TO SPEND 15 MINUTES IN THE SUN DAILY.  - Vitamin D (25 hydroxy)  4. Class 1 obesity due to excess calories with serious comorbidity and body mass index (BMI) of 34.0 to 34.9 in adult  She is encouraged to strive for BMi less than 30 to decrease cardiac risk. She was commended on her lifestyle changes and exercise regimen.   Maximino Greenland, MD    THE PATIENT IS ENCOURAGED TO PRACTICE SOCIAL DISTANCING DUE TO THE COVID-19 PANDEMIC.

## 2019-07-24 NOTE — Patient Instructions (Signed)
Vitamin D Deficiency Vitamin D deficiency is when your body does not have enough vitamin D. Vitamin D is important to your body because:  It helps your body use other minerals.  It helps to keep your bones strong and healthy.  It may help to prevent some diseases.  It helps your heart and other muscles work well. Not getting enough vitamin D can make your bones soft. It can also cause other health problems. What are the causes? This condition may be caused by:  Not eating enough foods that contain vitamin D.  Not getting enough sun.  Having diseases that make it hard for your body to absorb vitamin D.  Having a surgery in which a part of the stomach or a part of the small intestine is removed.  Having kidney disease or liver disease. What increases the risk? You are more likely to get this condition if:  You are older.  You do not spend much time outdoors.  You live in a nursing home.  You have had broken bones.  You have weak or thin bones (osteoporosis).  You have a disease or condition that changes how your body absorbs vitamin D.  You have dark skin.  You take certain medicines.  You are overweight or obese. What are the signs or symptoms?  In mild cases, there may not be any symptoms. If the condition is very bad, symptoms may include: ? Bone pain. ? Muscle pain. ? Falling often. ? Broken bones caused by a minor injury. How is this treated? Treatment may include taking supplements as told by your doctor. Your doctor will tell you what dose is best for you. Supplements may include:  Vitamin D.  Calcium. Follow these instructions at home: Eating and drinking   Eat foods that contain vitamin D, such as: ? Dairy products, cereals, or juices with added vitamin D. Check the label. ? Fish, such as salmon or trout. ? Eggs. ? Oysters. ? Mushrooms. The items listed above may not be a complete list of what you can eat and drink. Contact a dietitian for more  options. General instructions  Take medicines and supplements only as told by your doctor.  Get regular, safe exposure to natural sunlight.  Do not use a tanning bed.  Maintain a healthy weight. Lose weight if needed.  Keep all follow-up visits as told by your doctor. This is important. How is this prevented?  You can get vitamin D by: ? Eating foods that naturally contain vitamin D. ? Eating or drinking products that have vitamin D added to them, such as cereals, juices, and milk. ? Taking vitamin D or a multivitamin that contains vitamin D. ? Being in the sun. Your body makes vitamin D when your skin is exposed to sunlight. Your body changes the sunlight into a form of the vitamin that it can use. Contact a doctor if:  Your symptoms do not go away.  You feel sick to your stomach (nauseous).  You throw up (vomit).  You poop less often than normal, or you have trouble pooping (constipation). Summary  Vitamin D deficiency is when your body does not have enough vitamin D.  Vitamin D helps to keep your bones strong and healthy.  This condition is often treated by taking a supplement.  Your doctor will tell you what dose is best for you. This information is not intended to replace advice given to you by your health care provider. Make sure you discuss any questions you  have with your health care provider. Document Revised: 02/26/2018 Document Reviewed: 02/26/2018 Elsevier Patient Education  2020 Else[employer] Inc[employer]  

## 2019-07-25 ENCOUNTER — Encounter: Payer: Self-pay | Admitting: Internal Medicine

## 2019-07-25 LAB — CMP14+EGFR
ALT: 111 IU/L — ABNORMAL HIGH (ref 0–32)
AST: 98 IU/L — ABNORMAL HIGH (ref 0–40)
Albumin/Globulin Ratio: 1.8 (ref 1.2–2.2)
Albumin: 4.3 g/dL (ref 3.8–4.9)
Alkaline Phosphatase: 125 IU/L — ABNORMAL HIGH (ref 39–117)
BUN/Creatinine Ratio: 9 (ref 9–23)
BUN: 9 mg/dL (ref 6–24)
Bilirubin Total: 0.7 mg/dL (ref 0.0–1.2)
CO2: 22 mmol/L (ref 20–29)
Calcium: 9.3 mg/dL (ref 8.7–10.2)
Chloride: 100 mmol/L (ref 96–106)
Creatinine, Ser: 0.97 mg/dL (ref 0.57–1.00)
GFR calc Af Amer: 74 mL/min/{1.73_m2} (ref >59)
GFR calc non Af Amer: 64 mL/min/{1.73_m2} (ref >59)
Globulin, Total: 2.4 g/dL (ref 1.5–4.5)
Glucose: 261 mg/dL — ABNORMAL HIGH (ref 65–99)
Potassium: 4.4 mmol/L (ref 3.5–5.2)
Sodium: 137 mmol/L (ref 134–144)
Total Protein: 6.7 g/dL (ref 6.0–8.5)

## 2019-07-25 LAB — VITAMIN D 25 HYDROXY (VIT D DEFICIENCY, FRACTURES): Vit D, 25-Hydroxy: 31 ng/mL (ref 30.0–100.0)

## 2019-07-25 LAB — MAGNESIUM: Magnesium: 1.9 mg/dL (ref 1.6–2.3)

## 2019-07-25 LAB — HEMOGLOBIN A1C
Est. average glucose Bld gHb Est-mCnc: 200 mg/dL
Hgb A1c MFr Bld: 8.6 % — ABNORMAL HIGH (ref 4.8–5.6)

## 2019-07-26 ENCOUNTER — Encounter: Payer: Self-pay | Admitting: Internal Medicine

## 2019-08-07 ENCOUNTER — Encounter: Payer: Self-pay | Admitting: Internal Medicine

## 2019-08-07 ENCOUNTER — Other Ambulatory Visit: Payer: Self-pay

## 2019-08-07 ENCOUNTER — Ambulatory Visit: Payer: BC Managed Care – PPO | Admitting: Internal Medicine

## 2019-08-07 VITALS — BP 124/76 | HR 82 | Temp 98.1°F | Ht 65.4 in | Wt 203.6 lb

## 2019-08-07 DIAGNOSIS — E669 Obesity, unspecified: Secondary | ICD-10-CM

## 2019-08-07 DIAGNOSIS — Z6833 Body mass index (BMI) 33.0-33.9, adult: Secondary | ICD-10-CM

## 2019-08-07 DIAGNOSIS — Z23 Encounter for immunization: Secondary | ICD-10-CM | POA: Diagnosis not present

## 2019-08-07 DIAGNOSIS — E1165 Type 2 diabetes mellitus with hyperglycemia: Secondary | ICD-10-CM

## 2019-08-07 NOTE — Patient Instructions (Signed)
Inject 0.25mg  once weekly on Wednesdays.  Email Dr. Baird Cancer next week to let her know how you are doing.    Semaglutide injection solution What is this medicine? SEMAGLUTIDE (Sem a GLOO tide) is used to improve blood sugar control in adults with type 2 diabetes. This medicine may be used with other diabetes medicines. This drug may also reduce the risk of heart attack or stroke if you have type 2 diabetes and risk factors for heart disease. This medicine may be used for other purposes; ask your health care provider or pharmacist if you have questions. COMMON BRAND NAME(S): OZEMPIC What should I tell my health care provider before I take this medicine? They need to know if you have any of these conditions:  endocrine tumors (MEN 2) or if someone in your family had these tumors  eye disease, vision problems  history of pancreatitis  kidney disease  stomach problems  thyroid cancer or if someone in your family had thyroid cancer  an unusual or allergic reaction to semaglutide, other medicines, foods, dyes, or preservatives  pregnant or trying to get pregnant  breast-feeding How should I use this medicine? This medicine is for injection under the skin of your upper leg (thigh), stomach area, or upper arm. It is given once every week (every 7 days). You will be taught how to prepare and give this medicine. Use exactly as directed. Take your medicine at regular intervals. Do not take it more often than directed. If you use this medicine with insulin, you should inject this medicine and the insulin separately. Do not mix them together. Do not give the injections right next to each other. Change (rotate) injection sites with each injection. It is important that you put your used needles and syringes in a special sharps container. Do not put them in a trash can. If you do not have a sharps container, call your pharmacist or healthcare provider to get one. A special MedGuide will be given to  you by the pharmacist with each prescription and refill. Be sure to read this information carefully each time. This drug comes with INSTRUCTIONS FOR USE. Ask your pharmacist for directions on how to use this drug. Read the information carefully. Talk to your pharmacist or health care provider if you have questions. Talk to your pediatrician regarding the use of this medicine in children. Special care may be needed. Overdosage: If you think you have taken too much of this medicine contact a poison control center or emergency room at once. NOTE: This medicine is only for you. Do not share this medicine with others. What if I miss a dose? If you miss a dose, take it as soon as you can within 5 days after the missed dose. Then take your next dose at your regular weekly time. If it has been longer than 5 days after the missed dose, do not take the missed dose. Take the next dose at your regular time. Do not take double or extra doses. If you have questions about a missed dose, contact your health care provider for advice. What may interact with this medicine?  other medicines for diabetes Many medications may cause changes in blood sugar, these include:  alcohol containing beverages  antiviral medicines for HIV or AIDS  aspirin and aspirin-like drugs  certain medicines for blood pressure, heart disease, irregular heart beat  chromium  diuretics  female hormones, such as estrogens or progestins, birth control pills  fenofibrate  gemfibrozil  isoniazid  lanreotide  female hormones or anabolic steroids  MAOIs like Carbex, Eldepryl, Marplan, Nardil, and Parnate  medicines for weight loss  medicines for allergies, asthma, cold, or cough  medicines for depression, anxiety, or psychotic disturbances  niacin  nicotine  NSAIDs, medicines for pain and inflammation, like ibuprofen or naproxen  octreotide  pasireotide  pentamidine  phenytoin  probenecid  quinolone antibiotics  such as ciprofloxacin, levofloxacin, ofloxacin  some herbal dietary supplements  steroid medicines such as prednisone or cortisone  sulfamethoxazole; trimethoprim  thyroid hormones Some medications can hide the warning symptoms of low blood sugar (hypoglycemia). You may need to monitor your blood sugar more closely if you are taking one of these medications. These include:  beta-blockers, often used for high blood pressure or heart problems (examples include atenolol, metoprolol, propranolol)  clonidine  guanethidine  reserpine This list may not describe all possible interactions. Give your health care provider a list of all the medicines, herbs, non-prescription drugs, or dietary supplements you use. Also tell them if you smoke, drink alcohol, or use illegal drugs. Some items may interact with your medicine. What should I watch for while using this medicine? Visit your doctor or health care professional for regular checks on your progress. Drink plenty of fluids while taking this medicine. Check with your doctor or health care professional if you get an attack of severe diarrhea, nausea, and vomiting. The loss of too much body fluid can make it dangerous for you to take this medicine. A test called the HbA1C (A1C) will be monitored. This is a simple blood test. It measures your blood sugar control over the last 2 to 3 months. You will receive this test every 3 to 6 months. Learn how to check your blood sugar. Learn the symptoms of low and high blood sugar and how to manage them. Always carry a quick-source of sugar with you in case you have symptoms of low blood sugar. Examples include hard sugar candy or glucose tablets. Make sure others know that you can choke if you eat or drink when you develop serious symptoms of low blood sugar, such as seizures or unconsciousness. They must get medical help at once. Tell your doctor or health care professional if you have high blood sugar. You might  need to change the dose of your medicine. If you are sick or exercising more than usual, you might need to change the dose of your medicine. Do not skip meals. Ask your doctor or health care professional if you should avoid alcohol. Many nonprescription cough and cold products contain sugar or alcohol. These can affect blood sugar. Pens should never be shared. Even if the needle is changed, sharing may result in passing of viruses like hepatitis or HIV. Wear a medical ID bracelet or chain, and carry a card that describes your disease and details of your medicine and dosage times. Do not become pregnant while taking this medicine. Women should inform their doctor if they wish to become pregnant or think they might be pregnant. There is a potential for serious side effects to an unborn child. Talk to your health care professional or pharmacist for more information. What side effects may I notice from receiving this medicine? Side effects that you should report to your doctor or health care professional as soon as possible:  allergic reactions like skin rash, itching or hives, swelling of the face, lips, or tongue  breathing problems  changes in vision  diarrhea that continues or is severe  lump or swelling  on the neck  severe nausea  signs and symptoms of infection like fever or chills; cough; sore throat; pain or trouble passing urine  signs and symptoms of low blood sugar such as feeling anxious, confusion, dizziness, increased hunger, unusually weak or tired, sweating, shakiness, cold, irritable, headache, blurred vision, fast heartbeat, loss of consciousness  signs and symptoms of kidney injury like trouble passing urine or change in the amount of urine  trouble swallowing  unusual stomach upset or pain  vomiting Side effects that usually do not require medical attention (report to your doctor or health care professional if they continue or are  bothersome):  constipation  diarrhea  nausea  pain, redness, or irritation at site where injected  stomach upset This list may not describe all possible side effects. Call your doctor for medical advice about side effects. You may report side effects to FDA at 1-800-FDA-1088. Where should I keep my medicine? Keep out of the reach of children. Store unopened pens in a refrigerator between 2 and 8 degrees C (36 and 46 degrees F). Do not freeze. Protect from light and heat. After you first use the pen, it can be stored for 56 days at room temperature between 15 and 30 degrees C (59 and 86 degrees F) or in a refrigerator. Throw away your used pen after 56 days or after the expiration date, whichever comes first. Do not store your pen with the needle attached. If the needle is left on, medicine may leak from the pen. NOTE: This sheet is a summary. It may not cover all possible information. If you have questions about this medicine, talk to your doctor, pharmacist, or health care provider.  2020 Elsevier/Gold Standard (2019-03-05 09:41:51)

## 2019-08-07 NOTE — Progress Notes (Signed)
This visit occurred during the SARS-CoV-2 public health emergency.  Safety protocols were in place, including screening questions prior to the visit, additional usage of staff PPE, and extensive cleaning of exam room while observing appropriate contact time as indicated for disinfecting solutions.  Subjective:     Patient ID: Nichole George , female    DOB: [birth year] , 59 y.o.   MRN: [id number]   Chief Complaint  Patient presents with  . Diabetes  . Immunizations    pneumonia    HPI  She is here today for discussion regarding new onset diabetes. She had appt in Jan 2021 with a1c 8.6. She admits she was eating turnovers daily for a time period over the holidays. She has been exercising on a regular basis.   Diabetes She presents for her initial diabetic visit. She has type 2 diabetes mellitus. The initial diagnosis of diabetes was made 4 weeks ago. There are no hypoglycemic associated symptoms. Pertinent negatives for diabetes include no blurred vision and no chest pain. There are no hypoglycemic complications.     Past Medical History:  Diagnosis Date  . Arthritis    left ankle and right big toe and both knees  . Cervical dysplasia, moderate   . Hyperlipidemia   . Hypertension   . Type 2 diabetes mellitus (HCC)    diet  and exercise controlled     Family History  Problem Relation Age of Onset  . Cancer Other   . Hypertension Other   . Hyperlipidemia Other   . Healthy Mother   . Pneumonia Father   . Breast cancer Sister 57     Current Outpatient Medications:  .  Ascorbic Acid (VITAMIN C) POWD, Take by mouth. 1/2 tsp daily, Disp: , Rfl:  .  Cholecalciferol (VITAMIN D3) 50 MCG (2000 UT) capsule, Take 4,000 Units by mouth daily. , Disp: , Rfl:  .  fluticasone (FLONASE) 50 MCG/ACT nasal spray, SPRAY ONE SPRAY INTO EACH NOSTRIL DAILY, Disp: 48 g, Rfl: 5 .  loratadine (CLARITIN) 10 MG tablet, Take 10 mg by mouth daily., Disp: , Rfl:  .  valsartan (DIOVAN) 160 MG tablet,  TAKE 1 TABLET BY MOUTH EVERY DAY, Disp: 90 tablet, Rfl: 2 .  BLACK ELDERBERRY,BERRY-FLOWER, PO, Take by mouth. 2 tsp daily, Disp: , Rfl:    No Known Allergies   Review of Systems  Constitutional: Negative.   Eyes: Negative for blurred vision.  Respiratory: Negative.   Cardiovascular: Negative.  Negative for chest pain.  Gastrointestinal: Negative.   Neurological: Negative.   Psychiatric/Behavioral: Negative.      Today's Vitals   08/07/19 1433  BP: 124/76  Pulse: 82  Temp: 98.1 F (36.7 C)  TempSrc: Oral  Weight: 203 lb 9.6 oz (92.4 kg)  Height: 5' 5.4" (1.661 m)   Body mass index is 33.47 kg/m.   Objective:  Physical Exam Vitals and nursing note reviewed.  Constitutional:      Appearance: Normal appearance.  HENT:     Head: Normocephalic and atraumatic.  Cardiovascular:     Rate and Rhythm: Normal rate and regular rhythm.     Heart sounds: Normal heart sounds.  Pulmonary:     Effort: Pulmonary effort is normal.     Breath sounds: Normal breath sounds.  Skin:    General: Skin is warm.  Neurological:     General: No focal deficit present.     Mental Status: She is alert.  Psychiatric:        Mood and  Affect: Mood normal.        Behavior: Behavior normal.         Assessment And Plan:     1. Uncontrolled type 2 diabetes mellitus with hyperglycemia (HCC)  WE DISCUSSED STARTING OZEMPIC TO HELP HER ACHIEVE GLYCEMIC CONTROL. SHE WILL START WITH 0.25MG  ONCE WEEKLY X 2-4 WEEKS, THEN 0.5MG  ONCE WEEKLY. SHE DENIES FAMILY HISTORY OF THYROID CANCER. SHE WILL RTO IN SIX WEEKS FOR RE-EVALUATION.  Possible side effects were discussed with the patient in full detail. She also agrees to referral to [facility] for further nutritional guidance and education regarding Ozempic. She is in agreement with this referral.    2. Class 1 obesity with serious comorbidity and body mass index (BMI) of 33.0 to 33.9 in adult, unspecified obesity type  She is encouraged to focus on losing ten  percent of her body weight to decrease cardiac risk. Pt advised that Ozempic may result in weight loss and help support her weight loss efforts. She is reminded to stop eating when she feels full, to decrease risk of n/v.    3. Immunization due  She was given pneumovax-23 to update her immunization history.   Nichole Greenland, MD    THE PATIENT IS ENCOURAGED TO PRACTICE SOCIAL DISTANCING DUE TO THE COVID-19 PANDEMIC.

## 2019-08-15 ENCOUNTER — Encounter: Payer: Self-pay | Admitting: Internal Medicine

## 2019-08-17 ENCOUNTER — Encounter: Payer: Self-pay | Admitting: Internal Medicine

## 2019-08-30 ENCOUNTER — Encounter: Payer: Self-pay | Admitting: Internal Medicine

## 2019-09-18 ENCOUNTER — Ambulatory Visit: Payer: BC Managed Care – PPO | Admitting: Internal Medicine

## 2019-09-18 ENCOUNTER — Other Ambulatory Visit: Payer: Self-pay

## 2019-09-18 ENCOUNTER — Encounter: Payer: Self-pay | Admitting: Internal Medicine

## 2019-09-18 VITALS — BP 124/78 | HR 77 | Temp 98.0°F | Ht 65.4 in | Wt 199.8 lb

## 2019-09-18 DIAGNOSIS — E1165 Type 2 diabetes mellitus with hyperglycemia: Secondary | ICD-10-CM | POA: Diagnosis not present

## 2019-09-18 DIAGNOSIS — E6609 Other obesity due to excess calories: Secondary | ICD-10-CM | POA: Diagnosis not present

## 2019-09-18 DIAGNOSIS — Z6832 Body mass index (BMI) 32.0-32.9, adult: Secondary | ICD-10-CM

## 2019-09-18 NOTE — Progress Notes (Signed)
This visit occurred during the SARS-CoV-2 public health emergency.  Safety protocols were in place, including screening questions prior to the visit, additional usage of staff PPE, and extensive cleaning of exam room while observing appropriate contact time as indicated for disinfecting solutions.  Subjective:     Patient ID: Nichole George , female    DOB: [birth year] , 59 y.o.   MRN: [id number]   Chief Complaint  Patient presents with  . ozempic    f/u    HPI  She is here today for f/u diabetes. She was started on Ozempic at her last visit. She is currently dosing 0.74m once weekly. She has not had any issues with the medication thus far. She reports her sugars are "controlled".     Past Medical History:  Diagnosis Date  . Arthritis    left ankle and right big toe and both knees  . Cervical dysplasia, moderate   . Hyperlipidemia   . Hypertension   . Type 2 diabetes mellitus (HCC)    diet  and exercise controlled     Family History  Problem Relation Age of Onset  . Cancer Other   . Hypertension Other   . Hyperlipidemia Other   . Healthy Mother   . Pneumonia Father   . Breast cancer Sister 478    Current Outpatient Medications:  .  Ascorbic Acid (VITAMIN C) POWD, Take by mouth. 1/2 tsp daily, Disp: , Rfl:  .  BLACK ELDERBERRY,BERRY-FLOWER, PO, Take by mouth. 2 tsp daily, Disp: , Rfl:  .  Cholecalciferol (VITAMIN D3) 50 MCG (2000 UT) capsule, Take 5,000 Units by mouth daily. , Disp: , Rfl:  .  fluticasone (FLONASE) 50 MCG/ACT nasal spray, SPRAY ONE SPRAY INTO EACH NOSTRIL DAILY, Disp: 48 g, Rfl: 5 .  loratadine (CLARITIN) 10 MG tablet, Take 10 mg by mouth daily., Disp: , Rfl:  .  valsartan (DIOVAN) 160 MG tablet, TAKE 1 TABLET BY MOUTH EVERY DAY, Disp: 90 tablet, Rfl: 2   No Known Allergies   Review of Systems  Constitutional: Negative.   Respiratory: Negative.   Cardiovascular: Negative.   Gastrointestinal: Negative.   Neurological: Negative.    Psychiatric/Behavioral: Negative.      Today's Vitals   09/18/19 1214  BP: 124/78  Pulse: 77  Temp: 98 F (36.7 C)  TempSrc: Oral  SpO2: 94%  Weight: 199 lb 12.8 oz (90.6 kg)  Height: 5' 5.4" (1.661 m)   Body mass index is 32.84 kg/m.   Wt Readings from Last 3 Encounters:  09/18/19 199 lb 12.8 oz (90.6 kg)  08/07/19 203 lb 9.6 oz (92.4 kg)  07/24/19 208 lb 6.4 oz (94.5 kg)    Objective:  Physical Exam Vitals and nursing note reviewed.  Constitutional:      Appearance: Normal appearance.  HENT:     Head: Normocephalic and atraumatic.  Cardiovascular:     Rate and Rhythm: Normal rate and regular rhythm.     Heart sounds: Normal heart sounds.  Pulmonary:     Effort: Pulmonary effort is normal.     Breath sounds: Normal breath sounds.  Skin:    General: Skin is warm.  Neurological:     General: No focal deficit present.     Mental Status: She is alert.  Psychiatric:        Mood and Affect: Mood normal.        Behavior: Behavior normal.         Assessment And Plan:  1. Uncontrolled type 2 diabetes mellitus with hyperglycemia ([place])  New onset. She is advised to increase her Ozempic to 0.29m once weekly. She will rto next month for labwork. I will make further medication adjustments at that time. She is scheduled for eye exam later this year.   - Hemoglobin A1c; Future - BMP8+EGFR; Future  2. Class 1 obesity due to excess calories with serious comorbidity and body mass index (BMI) of 32.0 to 32.9 in adult  She was congratulated on her 4 pound weight loss since her last visit. She is encouraged to keep up the great work.  RMaximino Greenland MD    THE PATIENT IS ENCOURAGED TO PRACTICE SOCIAL DISTANCING DUE TO THE COVID-19 PANDEMIC.

## 2019-09-18 NOTE — Patient Instructions (Signed)
Diabetes Mellitus and Foot Care Foot care is an important part of your health, especially when you have diabetes. Diabetes may cause you to have problems because of poor blood flow (circulation) to your feet and legs, which can cause your skin to:  Become thinner and drier.  Break more easily.  Heal more slowly.  Peel and crack. You may also have nerve damage (neuropathy) in your legs and feet, causing decreased feeling in them. This means that you may not notice minor injuries to your feet that could lead to more serious problems. Noticing and addressing any potential problems early is the best way to prevent future foot problems. How to care for your feet Foot hygiene  Wash your feet daily with warm water and mild soap. Do not use hot water. Then, pat your feet and the areas between your toes until they are completely dry. Do not soak your feet as this can dry your skin.  Trim your toenails straight across. Do not dig under them or around the cuticle. File the edges of your nails with an emery board or nail file.  Apply a moisturizing lotion or petroleum jelly to the skin on your feet and to dry, brittle toenails. Use lotion that does not contain alcohol and is unscented. Do not apply lotion between your toes. Shoes and socks  Wear clean socks or stockings every day. Make sure they are not too tight. Do not wear knee-high stockings since they may decrease blood flow to your legs.  Wear shoes that fit properly and have enough cushioning. Always look in your shoes before you put them on to be sure there are no objects inside.  To break in new shoes, wear them for just a few hours a day. This prevents injuries on your feet. Wounds, scrapes, corns, and calluses  Check your feet daily for blisters, cuts, bruises, sores, and redness. If you cannot see the bottom of your feet, use a mirror or ask someone for help.  Do not cut corns or calluses or try to remove them with medicine.  If you  find a minor scrape, cut, or break in the skin on your feet, keep it and the skin around it clean and dry. You may clean these areas with mild soap and water. Do not clean the area with peroxide, alcohol, or iodine.  If you have a wound, scrape, corn, or callus on your foot, look at it several times a day to make sure it is healing and not infected. Check for: ? Redness, swelling, or pain. ? Fluid or blood. ? Warmth. ? Pus or a bad smell. General instructions  Do not cross your legs. This may decrease blood flow to your feet.  Do not use heating pads or hot water bottles on your feet. They may burn your skin. If you have lost feeling in your feet or legs, you may not know this is happening until it is too late.  Protect your feet from hot and cold by wearing shoes, such as at the beach or on hot pavement.  Schedule a complete foot exam at least once a year (annually) or more often if you have foot problems. If you have foot problems, report any cuts, sores, or bruises to your health care provider immediately. Contact a health care provider if:  You have a medical condition that increases your risk of infection and you have any cuts, sores, or bruises on your feet.  You have an injury that is not   healing.  You have redness on your legs or feet.  You feel burning or tingling in your legs or feet.  You have pain or cramps in your legs and feet.  Your legs or feet are numb.  Your feet always feel cold.  You have pain around a toenail. Get help right away if:  You have a wound, scrape, corn, or callus on your foot and: ? You have pain, swelling, or redness that gets worse. ? You have fluid or blood coming from the wound, scrape, corn, or callus. ? Your wound, scrape, corn, or callus feels warm to the touch. ? You have pus or a bad smell coming from the wound, scrape, corn, or callus. ? You have a fever. ? You have a red line going up your leg. Summary  Check your feet every day  for cuts, sores, red spots, swelling, and blisters.  Moisturize feet and legs daily.  Wear shoes that fit properly and have enough cushioning.  If you have foot problems, report any cuts, sores, or bruises to your health care provider immediately.  Schedule a complete foot exam at least once a year (annually) or more often if you have foot problems. This information is not intended to replace advice given to you by your health care provider. Make sure you discuss any questions you have with your health care provider. Document Revised: 03/13/2019 Document Reviewed: 07/22/2016 Elsevier Patient Education  2020 [employer]  

## 2019-10-07 ENCOUNTER — Encounter: Payer: Self-pay | Admitting: Internal Medicine

## 2019-10-09 ENCOUNTER — Other Ambulatory Visit: Payer: BC Managed Care – PPO

## 2019-10-09 ENCOUNTER — Other Ambulatory Visit: Payer: Self-pay

## 2019-10-09 DIAGNOSIS — E1165 Type 2 diabetes mellitus with hyperglycemia: Secondary | ICD-10-CM

## 2019-10-09 LAB — BMP8+EGFR
BUN/Creatinine Ratio: 12 (ref 9–23)
BUN: 11 mg/dL (ref 6–24)
CO2: 21 mmol/L (ref 20–29)
Calcium: 9.4 mg/dL (ref 8.7–10.2)
Chloride: 105 mmol/L (ref 96–106)
Creatinine, Ser: 0.91 mg/dL (ref 0.57–1.00)
GFR calc Af Amer: 80 mL/min/{1.73_m2} (ref >59)
GFR calc non Af Amer: 69 mL/min/{1.73_m2} (ref >59)
Glucose: 91 mg/dL (ref 65–99)
Potassium: 3.7 mmol/L (ref 3.5–5.2)
Sodium: 142 mmol/L (ref 134–144)

## 2019-10-09 LAB — HEMOGLOBIN A1C
Est. average glucose Bld gHb Est-mCnc: 120 mg/dL
Hgb A1c MFr Bld: 5.8 % — ABNORMAL HIGH (ref 4.8–5.6)

## 2019-10-22 ENCOUNTER — Encounter: Payer: Self-pay | Admitting: Internal Medicine

## 2019-11-07 ENCOUNTER — Encounter: Payer: Self-pay | Admitting: Internal Medicine

## 2019-11-07 ENCOUNTER — Other Ambulatory Visit: Payer: Self-pay

## 2019-11-07 MED ORDER — OZEMPIC (0.25 OR 0.5 MG/DOSE) 2 MG/1.5ML ~~LOC~~ SOPN: 0.5000 mg | PEN_INJECTOR | SUBCUTANEOUS | 3 refills | Status: DC

## 2019-11-12 ENCOUNTER — Encounter: Payer: Self-pay | Admitting: Internal Medicine

## 2019-12-10 ENCOUNTER — Encounter: Payer: Self-pay | Admitting: Internal Medicine

## 2019-12-10 ENCOUNTER — Other Ambulatory Visit: Payer: Self-pay

## 2019-12-10 MED ORDER — OZEMPIC (0.25 OR 0.5 MG/DOSE) 2 MG/1.5ML ~~LOC~~ SOPN: 0.5000 mg | PEN_INJECTOR | SUBCUTANEOUS | 3 refills | Status: DC

## 2019-12-11 ENCOUNTER — Other Ambulatory Visit: Payer: Self-pay | Admitting: Internal Medicine

## 2019-12-11 DIAGNOSIS — Z1231 Encounter for screening mammogram for malignant neoplasm of breast: Secondary | ICD-10-CM

## 2020-01-22 ENCOUNTER — Ambulatory Visit: Payer: BC Managed Care – PPO | Admitting: Internal Medicine

## 2020-01-22 ENCOUNTER — Encounter: Payer: Self-pay | Admitting: Internal Medicine

## 2020-01-22 ENCOUNTER — Other Ambulatory Visit: Payer: Self-pay

## 2020-01-22 VITALS — BP 116/78 | HR 75 | Temp 97.9°F | Ht 65.8 in | Wt 187.2 lb

## 2020-01-22 DIAGNOSIS — I10 Essential (primary) hypertension: Secondary | ICD-10-CM | POA: Diagnosis not present

## 2020-01-22 DIAGNOSIS — E559 Vitamin D deficiency, unspecified: Secondary | ICD-10-CM

## 2020-01-22 DIAGNOSIS — Z6830 Body mass index (BMI) 30.0-30.9, adult: Secondary | ICD-10-CM

## 2020-01-22 DIAGNOSIS — E1165 Type 2 diabetes mellitus with hyperglycemia: Secondary | ICD-10-CM

## 2020-01-22 DIAGNOSIS — E6609 Other obesity due to excess calories: Secondary | ICD-10-CM

## 2020-01-22 DIAGNOSIS — Z0000 Encounter for general adult medical examination without abnormal findings: Secondary | ICD-10-CM

## 2020-01-22 LAB — POCT URINALYSIS DIPSTICK
Bilirubin, UA: NEGATIVE
Glucose, UA: NEGATIVE
Ketones, UA: NEGATIVE
Leukocytes, UA: NEGATIVE
Nitrite, UA: NEGATIVE
Protein, UA: POSITIVE — AB
Spec Grav, UA: 1.02 (ref 1.010–1.025)
Urobilinogen, UA: 0.2 E.U./dL
pH, UA: 6.5 (ref 5.0–8.0)

## 2020-01-22 LAB — POCT UA - MICROALBUMIN
Albumin/Creatinine Ratio, Urine, POC: <30
Creatinine, POC: 300 mg/dL
Microalbumin Ur, POC: 30 mg/L

## 2020-01-22 MED ORDER — OZEMPIC (0.25 OR 0.5 MG/DOSE) 2 MG/1.5ML ~~LOC~~ SOPN: 0.5000 mg | PEN_INJECTOR | SUBCUTANEOUS | 3 refills | Status: DC

## 2020-01-22 MED ORDER — VALSARTAN 160 MG PO TABS: 160.0000 mg | ORAL_TABLET | Freq: Every day | ORAL | 2 refills | Status: DC

## 2020-01-22 NOTE — Progress Notes (Signed)
I,Katawbba Wiggins,acting as a Education administrator for Maximino Greenland, MD.,have documented all relevant documentation on the behalf of Maximino Greenland, MD,as directed by  Maximino Greenland, MD while in the presence of Maximino Greenland, MD. This visit occurred during the SARS-CoV-2 public health emergency.  Safety protocols were in place, including screening questions prior to the visit, additional usage of staff PPE, and extensive cleaning of exam room while observing appropriate contact time as indicated for disinfecting solutions.  Subjective:     Patient ID: Nichole George , female    DOB: 05-29-1961 , 59 y.o.   MRN: [id number]   Chief Complaint  Patient presents with  . Annual Exam  . Diabetes  . Hypertension    HPI  The patient is here today for a physical examination.  She is followed by [facility] for her GYN exams.   Diabetes She presents for her initial diabetic visit. She has type 2 diabetes mellitus. The initial diagnosis of diabetes was made 4 weeks ago. There are no hypoglycemic associated symptoms. Pertinent negatives for diabetes include no blurred vision and no chest pain. There are no hypoglycemic complications. She participates in exercise daily. An ACE inhibitor/angiotensin II receptor blocker is being taken. Eye exam is current.  Hypertension This is a chronic problem. The current episode started more than 1 year ago. The problem has been gradually improving since onset. The problem is controlled. Pertinent negatives include no blurred vision, chest pain or palpitations. Risk factors for coronary artery disease include diabetes mellitus, dyslipidemia and post-menopausal state. Past treatments include angiotensin blockers. The current treatment provides moderate improvement.     Past Medical History:  Diagnosis Date  . Arthritis    left ankle and right big toe and both knees  . Cervical dysplasia, moderate   . Hyperlipidemia   . Hypertension   . Type 2 diabetes mellitus (HCC)    diet   and exercise controlled     Family History  Problem Relation Age of Onset  . Cancer Other   . Hypertension Other   . Hyperlipidemia Other   . Healthy Mother   . Pneumonia Father   . Breast cancer Sister 50     Current Outpatient Medications:  .  APPLE CIDER VINEGAR PO, Take by mouth. 2 golis per day, Disp: , Rfl:  .  Cholecalciferol (VITAMIN D3) 50 MCG (2000 UT) capsule, Take 5,000 Units by mouth daily. , Disp: , Rfl:  .  fluticasone (FLONASE) 50 MCG/ACT nasal spray, SPRAY ONE SPRAY INTO EACH NOSTRIL DAILY, Disp: 48 g, Rfl: 5 .  loratadine (CLARITIN) 10 MG tablet, Take 10 mg by mouth daily., Disp: , Rfl:  .  Semaglutide,0.25 or 0.'5MG'$ /DOS, (OZEMPIC, 0.25 OR 0.5 MG/DOSE,) 2 MG/1.5ML SOPN, Inject 0.375 mLs (0.5 mg total) into the skin once a week. Inject 0.'50mg'$  into skin weekly., Disp: 3 pen, Rfl: 3 .  valsartan (DIOVAN) 160 MG tablet, Take 1 tablet (160 mg total) by mouth daily., Disp: 90 tablet, Rfl: 2 .  Ascorbic Acid (VITAMIN C) POWD, Take by mouth. 1/2 tsp daily (Patient not taking: Reported on 01/21/2020), Disp: , Rfl:  .  BLACK ELDERBERRY,BERRY-FLOWER, PO, Take by mouth. 2 tsp daily (Patient not taking: Reported on 01/21/2020), Disp: , Rfl:    No Known Allergies    The patient states she uses post menopausal status for birth control. Last LMP was No LMP recorded. Patient is postmenopausal.. Negative for Dysmenorrhea. Negative for: breast discharge, breast lump(s), breast pain and breast self exam.  Associated symptoms include abnormal vaginal bleeding. Pertinent negatives include abnormal bleeding (hematology), anxiety, decreased libido, depression, difficulty falling sleep, dyspareunia, history of infertility, nocturia, sexual dysfunction, sleep disturbances, urinary incontinence, urinary urgency, vaginal discharge and vaginal itching. Diet regular.The patient states her exercise level is  moderate.   . The patient's tobacco use is:  Social History   Tobacco Use  Smoking Status  Never Smoker  Smokeless Tobacco Never Used  . She has been exposed to passive smoke. The patient's alcohol use is:  Social History   Substance and Sexual Activity  Alcohol Use Yes   Comment: occ wine    Review of Systems  Constitutional: Negative.   HENT: Negative.   Eyes: Negative.  Negative for blurred vision.  Respiratory: Negative.   Cardiovascular: Negative.  Negative for chest pain and palpitations.  Gastrointestinal: Negative.   Endocrine: Negative.   Genitourinary: Negative.   Musculoskeletal: Negative.   Skin: Negative.   Allergic/Immunologic: Negative.   Neurological: Negative.   Hematological: Negative.   Psychiatric/Behavioral: Negative.      Today's Vitals   01/22/20 0901  BP: 116/78  Pulse: 75  Temp: 97.9 F (36.6 C)  TempSrc: Oral  Weight: 187 lb 3.2 oz (84.9 kg)  Height: 5' 5.8" (1.671 m)   Body mass index is 30.4 kg/m.  Wt Readings from Last 3 Encounters:  01/22/20 187 lb 3.2 oz (84.9 kg)  09/18/19 199 lb 12.8 oz (90.6 kg)  08/07/19 203 lb 9.6 oz (92.4 kg)   Objective:  Physical Exam Vitals and nursing note reviewed.  Constitutional:      General: She is not in acute distress.    Appearance: Normal appearance. She is well-developed. She is obese.  HENT:     Head: Normocephalic and atraumatic.     Right Ear: Hearing, tympanic membrane, ear canal and external ear normal. There is no impacted cerumen.     Left Ear: Hearing, tympanic membrane, ear canal and external ear normal. There is no impacted cerumen.     Nose: Nose normal.     Mouth/Throat:     Mouth: Mucous membranes are dry.     Pharynx: Oropharynx is clear.  Eyes:     General: Lids are normal.     Extraocular Movements: Extraocular movements intact.     Conjunctiva/sclera: Conjunctivae normal.     Pupils: Pupils are equal, round, and reactive to light.     Funduscopic exam:    Right eye: No papilledema.        Left eye: No papilledema.  Neck:     Thyroid: No thyroid mass.      Vascular: No carotid bruit.  Cardiovascular:     Rate and Rhythm: Normal rate and regular rhythm.     Pulses: Normal pulses.          Dorsalis pedis pulses are 2+ on the right side and 2+ on the left side.     Heart sounds: Normal heart sounds. No murmur heard.   Pulmonary:     Effort: Pulmonary effort is normal.     Breath sounds: Normal breath sounds.  Chest:     Breasts: Tanner Score is 5.        Right: Normal. No swelling, bleeding, inverted nipple, mass, nipple discharge or skin change.        Left: Normal. No swelling, bleeding, inverted nipple, mass, nipple discharge or skin change.  Abdominal:     General: Abdomen is flat. Bowel sounds are normal. There is no distension.  Palpations: Abdomen is soft.     Tenderness: There is no abdominal tenderness.  Genitourinary:    Rectum: Guaiac result negative.     Comments: deferred Musculoskeletal:        General: No swelling. Normal range of motion.     Cervical back: Full passive range of motion without pain, normal range of motion and neck supple.     Right lower leg: No edema.     Left lower leg: No edema.  Feet:     Right foot:     Protective Sensation: 5 sites tested. 5 sites sensed.     Skin integrity: Skin integrity normal.     Toenail Condition: Right toenails are normal.     Left foot:     Protective Sensation: 5 sites tested. 5 sites sensed.     Skin integrity: Skin integrity normal.     Toenail Condition: Left toenails are normal.  Skin:    General: Skin is warm and dry.     Capillary Refill: Capillary refill takes less than 2 seconds.  Neurological:     General: No focal deficit present.     Mental Status: She is alert and oriented to person, place, and time.     Cranial Nerves: No cranial nerve deficit.     Sensory: No sensory deficit.  Psychiatric:        Mood and Affect: Mood normal.        Behavior: Behavior normal.        Thought Content: Thought content normal.        Judgment: Judgment normal.          Assessment And Plan:     1. Routine general medical examination at a health care facility  Comments: A full exam was performed. Importance of monthly self breast exams was discussed with the patient.  PATIENT IS ADVISED TO GET 30-45 MINUTES REGULAR EXERCISE NO LESS THAN FOUR TO FIVE DAYS PER WEEK - BOTH WEIGHTBEARING EXERCISES AND AEROBIC ARE RECOMMENDED.  PATIENT IS ADVISED TO FOLLOW A HEALTHY DIET WITH AT LEAST SIX FRUITS/VEGGIES PER DAY, DECREASE INTAKE OF RED MEAT, AND TO INCREASE FISH INTAKE TO TWO DAYS PER WEEK.  MEATS/FISH SHOULD NOT BE FRIED, BAKED OR BROILED IS PREFERABLE.  I SUGGEST WEARING SPF 50 SUNSCREEN ON EXPOSED PARTS AND ESPECIALLY WHEN IN THE DIRECT SUNLIGHT FOR AN EXTENDED PERIOD OF TIME.  PLEASE AVOID FAST FOOD RESTAURANTS AND INCREASE YOUR WATER INTAKE.  - Hepatitis C antibody - VITAMIN D 25 Hydroxy (Vit-D Deficiency, Fractures) - CBC - CMP14+EGFR - Lipid panel  2. Uncontrolled type 2 diabetes mellitus with hyperglycemia ([place])  Comments: Diabetic foot exam was performed. I DISCUSSED WITH THE PATIENT AT LENGTH REGARDING THE GOALS OF GLYCEMIC CONTROL AND POSSIBLE LONG-TERM COMPLICATIONS.  I  ALSO STRESSED THE IMPORTANCE OF COMPLIANCE WITH HOME GLUCOSE MONITORING, DIETARY RESTRICTIONS INCLUDING AVOIDANCE OF SUGARY DRINKS/PROCESSED FOODS,  ALONG WITH REGULAR EXERCISE.  I  ALSO STRESSED THE IMPORTANCE OF ANNUAL EYE EXAMS, SELF FOOT CARE AND COMPLIANCE WITH OFFICE VISITS.  - POCT Urinalysis Dipstick (81002) - POCT UA - Microalbumin - Hemoglobin A1c  3. Chronic hypertension  Comments: Chronic, well controlled. She will continue with current meds. She is encouraged to avoid adding salt to her foods. EKG performed, NSR w/o acute changes. She will rto in six months for re-evaluation.   - EKG 12-Lead  4. Vitamin D deficiency disease  I WILL CHECK A VIT D LEVEL AND SUPPLEMENT AS NEEDED.  ALSO ENCOURAGED TO SPEND  [place].  5. Class 1 obesity due to  excess calories with serious comorbidity and body mass index (BMI) of 30.0 to 30.9 in adult  She is encouraged to strive for BMI less than 27 to decrease cardiac risk. Advised to aim for at least 150 minutes of exercise per week.     Patient was given opportunity to ask questions. Patient verbalized understanding of the plan and was able to repeat key elements of the plan. All questions were answered to their satisfaction.   Maximino Greenland, MD   I, Maximino Greenland, MD, have reviewed all documentation for this visit. The documentation on 01/22/20 for the exam, diagnosis, procedures, and orders are all accurate and complete.  THE PATIENT IS ENCOURAGED TO PRACTICE SOCIAL DISTANCING DUE TO THE COVID-19 PANDEMIC.

## 2020-01-23 LAB — CBC
Hematocrit: 45 % (ref 34.0–46.6)
Hemoglobin: 15.2 g/dL (ref 11.1–15.9)
MCH: 31.5 pg (ref 26.6–33.0)
MCHC: 33.8 g/dL (ref 31.5–35.7)
MCV: 93 fL (ref 79–97)
Platelets: 174 10*3/uL (ref 150–450)
RBC: 4.82 x10E6/uL (ref 3.77–5.28)
RDW: 12.4 % (ref 11.7–15.4)
WBC: 5.8 10*3/uL (ref 3.4–10.8)

## 2020-01-23 LAB — LIPID PANEL
Chol/HDL Ratio: 3 ratio (ref 0.0–4.4)
Cholesterol, Total: 200 mg/dL — ABNORMAL HIGH (ref 100–199)
HDL: 67 mg/dL (ref >39)
LDL Chol Calc (NIH): 122 mg/dL — ABNORMAL HIGH (ref 0–99)
Triglycerides: 61 mg/dL (ref 0–149)
VLDL Cholesterol Cal: 11 mg/dL (ref 5–40)

## 2020-01-23 LAB — VITAMIN D 25 HYDROXY (VIT D DEFICIENCY, FRACTURES): Vit D, 25-Hydroxy: 51.1 ng/mL (ref 30.0–100.0)

## 2020-01-23 LAB — CMP14+EGFR
ALT: 32 IU/L (ref 0–32)
AST: 28 IU/L (ref 0–40)
Albumin/Globulin Ratio: 1.6 (ref 1.2–2.2)
Albumin: 4.7 g/dL (ref 3.8–4.9)
Alkaline Phosphatase: 109 IU/L (ref 48–121)
BUN/Creatinine Ratio: 9 (ref 9–23)
BUN: 9 mg/dL (ref 6–24)
Bilirubin Total: 0.9 mg/dL (ref 0.0–1.2)
CO2: 22 mmol/L (ref 20–29)
Calcium: 9.7 mg/dL (ref 8.7–10.2)
Chloride: 104 mmol/L (ref 96–106)
Creatinine, Ser: 0.99 mg/dL (ref 0.57–1.00)
GFR calc Af Amer: 72 mL/min/{1.73_m2} (ref >59)
GFR calc non Af Amer: 63 mL/min/{1.73_m2} (ref >59)
Globulin, Total: 2.9 g/dL (ref 1.5–4.5)
Glucose: 81 mg/dL (ref 65–99)
Potassium: 4.1 mmol/L (ref 3.5–5.2)
Sodium: 142 mmol/L (ref 134–144)
Total Protein: 7.6 g/dL (ref 6.0–8.5)

## 2020-01-23 LAB — HEMOGLOBIN A1C
Est. average glucose Bld gHb Est-mCnc: 108 mg/dL
Hgb A1c MFr Bld: 5.4 % (ref 4.8–5.6)

## 2020-01-23 LAB — HEPATITIS C ANTIBODY: Hep C Virus Ab: <0.1 s/co ratio (ref 0.0–0.9)

## 2020-01-27 ENCOUNTER — Encounter: Payer: Self-pay | Admitting: Internal Medicine

## 2020-02-05 ENCOUNTER — Other Ambulatory Visit: Payer: Self-pay

## 2020-02-05 ENCOUNTER — Ambulatory Visit
Admission: RE | Admit: 2020-02-05 | Discharge: 2020-02-05 | Disposition: A | Discharge status: Home / Self Care | Payer: BC Managed Care – PPO | Source: Ambulatory Visit | Attending: Internal Medicine | Admitting: Internal Medicine

## 2020-02-05 DIAGNOSIS — Z1231 Encounter for screening mammogram for malignant neoplasm of breast: Secondary | ICD-10-CM

## 2020-02-12 ENCOUNTER — Encounter: Payer: Self-pay | Admitting: Internal Medicine

## 2020-03-02 ENCOUNTER — Encounter: Payer: Self-pay | Admitting: Internal Medicine

## 2020-03-19 ENCOUNTER — Other Ambulatory Visit: Payer: Self-pay

## 2020-03-19 ENCOUNTER — Other Ambulatory Visit: Payer: BC Managed Care – PPO

## 2020-03-19 DIAGNOSIS — Z1152 Encounter for screening for COVID-19: Secondary | ICD-10-CM

## 2020-03-21 LAB — SARS-COV-2, NAA 2 DAY TAT

## 2020-03-21 LAB — NOVEL CORONAVIRUS, NAA: SARS-CoV-2, NAA: NOT DETECTED

## 2020-04-25 LAB — HM DIABETES EYE EXAM

## 2020-05-18 ENCOUNTER — Encounter: Payer: Self-pay | Admitting: Internal Medicine

## 2020-05-21 ENCOUNTER — Ambulatory Visit (INDEPENDENT_AMBULATORY_CARE_PROVIDER_SITE_OTHER): Payer: BC Managed Care – PPO | Admitting: Internal Medicine

## 2020-05-21 ENCOUNTER — Encounter: Payer: Self-pay | Admitting: Internal Medicine

## 2020-05-21 ENCOUNTER — Other Ambulatory Visit: Payer: Self-pay

## 2020-05-21 VITALS — BP 118/76 | HR 81 | Temp 98.1°F | Ht 65.8 in | Wt 189.6 lb

## 2020-05-21 DIAGNOSIS — E1165 Type 2 diabetes mellitus with hyperglycemia: Secondary | ICD-10-CM

## 2020-05-21 DIAGNOSIS — I10 Essential (primary) hypertension: Secondary | ICD-10-CM

## 2020-05-21 DIAGNOSIS — Z23 Encounter for immunization: Secondary | ICD-10-CM | POA: Diagnosis not present

## 2020-05-21 DIAGNOSIS — E6609 Other obesity due to excess calories: Secondary | ICD-10-CM | POA: Diagnosis not present

## 2020-05-21 DIAGNOSIS — E66811 Obesity, class 1: Secondary | ICD-10-CM

## 2020-05-21 DIAGNOSIS — Z6830 Body mass index (BMI) 30.0-30.9, adult: Secondary | ICD-10-CM

## 2020-05-21 DIAGNOSIS — M5412 Radiculopathy, cervical region: Secondary | ICD-10-CM

## 2020-05-21 NOTE — Patient Instructions (Signed)
Diabetes Mellitus and Foot Care Foot care is an important part of your health, especially when you have diabetes. Diabetes may cause you to have problems because of poor blood flow (circulation) to your feet and legs, which can cause your skin to:  Become thinner and drier.  Break more easily.  Heal more slowly.  Peel and crack. You may also have nerve damage (neuropathy) in your legs and feet, causing decreased feeling in them. This means that you may not notice minor injuries to your feet that could lead to more serious problems. Noticing and addressing any potential problems early is the best way to prevent future foot problems. How to care for your feet Foot hygiene  Wash your feet daily with warm water and mild soap. Do not use hot water. Then, pat your feet and the areas between your toes until they are completely dry. Do not soak your feet as this can dry your skin.  Trim your toenails straight across. Do not dig under them or around the cuticle. File the edges of your nails with an emery board or nail file.  Apply a moisturizing lotion or petroleum jelly to the skin on your feet and to dry, brittle toenails. Use lotion that does not contain alcohol and is unscented. Do not apply lotion between your toes. Shoes and socks  Wear clean socks or stockings every day. Make sure they are not too tight. Do not wear knee-high stockings since they may decrease blood flow to your legs.  Wear shoes that fit properly and have enough cushioning. Always look in your shoes before you put them on to be sure there are no objects inside.  To break in new shoes, wear them for just a few hours a day. This prevents injuries on your feet. Wounds, scrapes, corns, and calluses  Check your feet daily for blisters, cuts, bruises, sores, and redness. If you cannot see the bottom of your feet, use a mirror or ask someone for help.  Do not cut corns or calluses or try to remove them with medicine.  If you  find a minor scrape, cut, or break in the skin on your feet, keep it and the skin around it clean and dry. You may clean these areas with mild soap and water. Do not clean the area with peroxide, alcohol, or iodine.  If you have a wound, scrape, corn, or callus on your foot, look at it several times a day to make sure it is healing and not infected. Check for: ? Redness, swelling, or pain. ? Fluid or blood. ? Warmth. ? Pus or a bad smell. General instructions  Do not cross your legs. This may decrease blood flow to your feet.  Do not use heating pads or hot water bottles on your feet. They may burn your skin. If you have lost feeling in your feet or legs, you may not know this is happening until it is too late.  Protect your feet from hot and cold by wearing shoes, such as at the beach or on hot pavement.  Schedule a complete foot exam at least once a year (annually) or more often if you have foot problems. If you have foot problems, report any cuts, sores, or bruises to your health care provider immediately. Contact a health care provider if:  You have a medical condition that increases your risk of infection and you have any cuts, sores, or bruises on your feet.  You have an injury that is not   healing.  You have redness on your legs or feet.  You feel burning or tingling in your legs or feet.  You have pain or cramps in your legs and feet.  Your legs or feet are numb.  Your feet always feel cold.  You have pain around a toenail. Get help right away if:  You have a wound, scrape, corn, or callus on your foot and: ? You have pain, swelling, or redness that gets worse. ? You have fluid or blood coming from the wound, scrape, corn, or callus. ? Your wound, scrape, corn, or callus feels warm to the touch. ? You have pus or a bad smell coming from the wound, scrape, corn, or callus. ? You have a fever. ? You have a red line going up your leg. Summary  Check your feet every day  for cuts, sores, red spots, swelling, and blisters.  Moisturize feet and legs daily.  Wear shoes that fit properly and have enough cushioning.  If you have foot problems, report any cuts, sores, or bruises to your health care provider immediately.  Schedule a complete foot exam at least once a year (annually) or more often if you have foot problems. This information is not intended to replace advice given to you by your health care provider. Make sure you discuss any questions you have with your health care provider. Document Revised: 03/13/2019 Document Reviewed: 07/22/2016 Elsevier Patient Education  2020 [employer]  

## 2020-05-21 NOTE — Progress Notes (Signed)
I,Katawbba Wiggins,acting as a Education administrator for Maximino Greenland, MD.,have documented all relevant documentation on the behalf of Maximino Greenland, MD,as directed by  Maximino Greenland, MD while in the presence of Maximino Greenland, MD.  This visit occurred during the SARS-CoV-2 public health emergency.  Safety protocols were in place, including screening questions prior to the visit, additional usage of staff PPE, and extensive cleaning of exam room while observing appropriate contact time as indicated for disinfecting solutions.  Subjective:     Patient ID: Nichole George , female    DOB: 06-06-1961 , 59 y.o.   MRN: [id number]   Chief Complaint  Patient presents with  . Diabetes  . Hypertension    HPI  She is here today for f/u diabetes and blood pressure.  She reports compliance with meds.   Diabetes She presents for her initial diabetic visit. She has type 2 diabetes mellitus. The initial diagnosis of diabetes was made 4 weeks ago. There are no hypoglycemic associated symptoms. Pertinent negatives for diabetes include no blurred vision and no chest pain. There are no hypoglycemic complications. She has not had a previous visit with a dietitian. She participates in exercise three times a week. An ACE inhibitor/angiotensin II receptor blocker is being taken. Eye exam is current.  Hypertension This is a chronic problem. The current episode started more than 1 year ago. The problem has been gradually improving since onset. The problem is uncontrolled. Pertinent negatives include no blurred vision or chest pain. Risk factors for coronary artery disease include obesity and post-menopausal state. The current treatment provides moderate improvement.     Past Medical History:  Diagnosis Date  . Arthritis    left ankle and right big toe and both knees  . Cervical dysplasia, moderate   . Hyperlipidemia   . Hypertension   . Type 2 diabetes mellitus (HCC)    diet  and exercise controlled     Family  History  Problem Relation Age of Onset  . Cancer Other   . Hypertension Other   . Hyperlipidemia Other   . Healthy Mother   . Pneumonia Father   . Breast cancer Sister 56     Current Outpatient Medications:  .  APPLE CIDER VINEGAR PO, Take by mouth. 2 golis per day, Disp: , Rfl:  .  Ascorbic Acid (VITAMIN C) POWD, Take by mouth. 1/2 tsp daily , Disp: , Rfl:  .  Cholecalciferol (VITAMIN D3) 50 MCG (2000 UT) capsule, Take 5,000 Units by mouth daily. , Disp: , Rfl:  .  loratadine (CLARITIN) 10 MG tablet, Take 10 mg by mouth daily., Disp: , Rfl:  .  Semaglutide,0.25 or 0.5MG /DOS, (OZEMPIC, 0.25 OR 0.5 MG/DOSE,) 2 MG/1.5ML SOPN, Inject 0.375 mLs (0.5 mg total) into the skin once a week. Inject 0.50mg  into skin weekly., Disp: 3 pen, Rfl: 3 .  valsartan (DIOVAN) 160 MG tablet, Take 1 tablet (160 mg total) by mouth daily., Disp: 90 tablet, Rfl: 2 .  BLACK ELDERBERRY,BERRY-FLOWER, PO, Take by mouth. 2 tsp daily (Patient not taking: Reported on 05/21/2020), Disp: , Rfl:  .  fluticasone (FLONASE) 50 MCG/ACT nasal spray, SPRAY ONE SPRAY INTO EACH NOSTRIL DAILY (Patient not taking: Reported on 05/21/2020), Disp: 48 g, Rfl: 5   No Known Allergies   Review of Systems  Constitutional: Negative.   Eyes: Negative for blurred vision.  Respiratory: Negative.   Cardiovascular: Negative.  Negative for chest pain.  Gastrointestinal: Negative.   Neurological: Positive for numbness (left side  of body).       She c/o LUE numbness. Sx started yesterday. Denies LUE weakness. Has h/o cervicalgia. Followed by Chiro  Psychiatric/Behavioral: Negative.   All other systems reviewed and are negative.    Today's Vitals   05/21/20 1054  BP: 118/76  Pulse: 81  Temp: 98.1 F (36.7 C)  TempSrc: Oral  Weight: 189 lb 9.6 oz (86 kg)  Height: 5' 5.8" (1.671 m)   Body mass index is 30.79 kg/m.  Wt Readings from Last 3 Encounters:  05/21/20 189 lb 9.6 oz (86 kg)  01/22/20 187 lb 3.2 oz (84.9 kg)  09/18/19 199 lb  12.8 oz (90.6 kg)   Objective:  Physical Exam Vitals and nursing note reviewed.  Constitutional:      Appearance: Normal appearance. She is obese.  HENT:     Head: Normocephalic and atraumatic.     Nose:     Comments: Deferred, masked    Mouth/Throat:     Comments: Deferred, masked Neck:     Comments: Tight trapezius mm. She has full ROM Cardiovascular:     Rate and Rhythm: Normal rate and regular rhythm.     Heart sounds: Normal heart sounds.  Pulmonary:     Breath sounds: Normal breath sounds.  Skin:    General: Skin is warm.  Neurological:     General: No focal deficit present.     Mental Status: She is alert and oriented to person, place, and time.         Assessment And Plan:     1. Uncontrolled type 2 diabetes mellitus with hyperglycemia (HCC) Comments: Chronic. I will check labs as listed below. She is encouraged to get COVID booster vaccine.  She will rto in 4 months for re-evaluation.  - Hemoglobin A1c - BMP8+EGFR  2. Chronic hypertension Comments: Chronic, well controlled. She will continue with current meds. She is encouraged to follow a low sodium diet.   3. Cervical radiculopathy Comments: I will schedule her for MRI cervical spine. I will make further recommendations once her results are available. She will c/w chiro therapy. - MR Cervical Spine Wo Contrast; Future  4. Class 1 obesity due to excess calories with serious comorbidity and body mass index (BMI) of 30.0 to 30.9 in adult Comments: She is encouraged to strive for BMI less than 28 to decrease cardiac risk. Advised to continue with her current exercise regimen.   5. Need for immunization against influenza - Flu Vaccine QUAD 6+ mos PF IM (Fluarix Quad PF)    Patient was given opportunity to ask questions. Patient verbalized understanding of the plan and was able to repeat key elements of the plan. All questions were answered to their satisfaction.  Maximino Greenland, MD   I, Maximino Greenland,  MD, have reviewed all documentation for this visit. The documentation on 05/21/20 for the exam, diagnosis, procedures, and orders are all accurate and complete.  THE PATIENT IS ENCOURAGED TO PRACTICE SOCIAL DISTANCING DUE TO THE COVID-19 PANDEMIC.

## 2020-05-22 LAB — BMP8+EGFR
BUN/Creatinine Ratio: 14 (ref 12–28)
BUN: 13 mg/dL (ref 8–27)
CO2: 24 mmol/L (ref 20–29)
Calcium: 9.2 mg/dL (ref 8.7–10.3)
Chloride: 105 mmol/L (ref 96–106)
Creatinine, Ser: 0.91 mg/dL (ref 0.57–1.00)
GFR calc Af Amer: 79 mL/min/{1.73_m2} (ref >59)
GFR calc non Af Amer: 69 mL/min/{1.73_m2} (ref >59)
Glucose: 81 mg/dL (ref 65–99)
Potassium: 4.4 mmol/L (ref 3.5–5.2)
Sodium: 144 mmol/L (ref 134–144)

## 2020-05-22 LAB — HEMOGLOBIN A1C
Est. average glucose Bld gHb Est-mCnc: 111 mg/dL
Hgb A1c MFr Bld: 5.5 % (ref 4.8–5.6)

## 2020-05-26 ENCOUNTER — Encounter: Payer: Self-pay | Admitting: Internal Medicine

## 2020-06-05 ENCOUNTER — Telehealth: Payer: Self-pay

## 2020-06-05 NOTE — Telephone Encounter (Signed)
Left the pt a message to call back so that she can be notified that the pt will need to see a specialist, her insurance denied coverage for the pt's mri of her neck.

## 2020-06-09 ENCOUNTER — Encounter: Payer: Self-pay | Admitting: Internal Medicine

## 2020-06-11 ENCOUNTER — Other Ambulatory Visit: Payer: BC Managed Care – PPO

## 2020-07-16 ENCOUNTER — Ambulatory Visit: Payer: BC Managed Care – PPO | Attending: Family

## 2020-07-16 DIAGNOSIS — Z23 Encounter for immunization: Secondary | ICD-10-CM

## 2020-08-07 ENCOUNTER — Encounter: Payer: Self-pay | Admitting: Internal Medicine

## 2020-08-09 ENCOUNTER — Encounter: Payer: Self-pay | Admitting: Internal Medicine

## 2020-09-17 ENCOUNTER — Encounter: Payer: Self-pay | Admitting: Internal Medicine

## 2020-09-21 ENCOUNTER — Ambulatory Visit: Payer: BC Managed Care – PPO | Admitting: Internal Medicine

## 2020-09-21 ENCOUNTER — Ambulatory Visit (INDEPENDENT_AMBULATORY_CARE_PROVIDER_SITE_OTHER): Payer: BC Managed Care – PPO | Admitting: Internal Medicine

## 2020-09-21 ENCOUNTER — Encounter: Payer: Self-pay | Admitting: Internal Medicine

## 2020-09-21 ENCOUNTER — Other Ambulatory Visit: Payer: Self-pay

## 2020-09-21 VITALS — BP 136/88 | HR 86 | Temp 98.1°F | Ht 65.8 in | Wt 199.0 lb

## 2020-09-21 DIAGNOSIS — Z6832 Body mass index (BMI) 32.0-32.9, adult: Secondary | ICD-10-CM

## 2020-09-21 DIAGNOSIS — L989 Disorder of the skin and subcutaneous tissue, unspecified: Secondary | ICD-10-CM

## 2020-09-21 DIAGNOSIS — E1165 Type 2 diabetes mellitus with hyperglycemia: Secondary | ICD-10-CM | POA: Diagnosis not present

## 2020-09-21 DIAGNOSIS — E7800 Pure hypercholesterolemia, unspecified: Secondary | ICD-10-CM | POA: Diagnosis not present

## 2020-09-21 DIAGNOSIS — I10 Essential (primary) hypertension: Secondary | ICD-10-CM

## 2020-09-21 DIAGNOSIS — E6609 Other obesity due to excess calories: Secondary | ICD-10-CM

## 2020-09-21 MED ORDER — LORATADINE 10 MG PO TABS: 10.0000 mg | ORAL_TABLET | Freq: Every day | ORAL | 1 refills | Status: AC

## 2020-09-21 MED ORDER — FLUTICASONE PROPIONATE 50 MCG/ACT NA SUSP: NASAL | 5 refills | Status: DC

## 2020-09-21 NOTE — Progress Notes (Signed)
I,Katawbba Wiggins,acting as a Education administrator for Maximino Greenland, MD.,have documented all relevant documentation on the behalf of Maximino Greenland, MD,as directed by  Maximino Greenland, MD while in the presence of Maximino Greenland, MD.  This visit occurred during the SARS-CoV-2 public health emergency.  Safety protocols were in place, including screening questions prior to the visit, additional usage of staff PPE, and extensive cleaning of exam room while observing appropriate contact time as indicated for disinfecting solutions.  Subjective:     Patient ID: Nichole George , female    DOB: December 15, 1960 , 60 y.o.   MRN: [id number]   Chief Complaint  Patient presents with  . Diabetes  . Hypertension    HPI  She reports for DM/HTN f/u. She reports compliance with meds. States she is exercising regularly. States she came from work today, and thinks this is why her BP is elevated today.   Diabetes She presents for her initial diabetic visit. She has type 2 diabetes mellitus. The initial diagnosis of diabetes was made 4 weeks ago. There are no hypoglycemic associated symptoms. Pertinent negatives for diabetes include no blurred vision and no chest pain. There are no hypoglycemic complications. Risk factors for coronary artery disease include diabetes mellitus, hypertension, post-menopausal and obesity. She is following a diabetic diet. She participates in exercise three times a week. Eye exam is current.  Hypertension This is a chronic problem. The problem has been gradually improving since onset. The problem is uncontrolled. Pertinent negatives include no blurred vision, chest pain, palpitations or shortness of breath. The current treatment provides moderate improvement. Compliance problems include exercise.      Past Medical History:  Diagnosis Date  . Arthritis    left ankle and right big toe and both knees  . Cervical dysplasia, moderate   . Hyperlipidemia   . Hypertension   . Type 2 diabetes  mellitus (HCC)    diet  and exercise controlled     Family History  Problem Relation Age of Onset  . Cancer Other   . Hypertension Other   . Hyperlipidemia Other   . Healthy Mother   . Pneumonia Father   . Breast cancer Sister 30     Current Outpatient Medications:  .  APPLE CIDER VINEGAR PO, Take by mouth. 2 golis per day, Disp: , Rfl:  .  Ascorbic Acid (VITAMIN C) POWD, Take by mouth. 1/2 tsp daily , Disp: , Rfl:  .  Cholecalciferol (VITAMIN D3) 50 MCG (2000 UT) capsule, Take 5,000 Units by mouth daily. , Disp: , Rfl:  .  Semaglutide,0.25 or 0.5MG /DOS, (OZEMPIC, 0.25 OR 0.5 MG/DOSE,) 2 MG/1.5ML SOPN, Inject 0.375 mLs (0.5 mg total) into the skin once a week. Inject 0.50mg  into skin weekly., Disp: 3 pen, Rfl: 3 .  valsartan (DIOVAN) 160 MG tablet, Take 1 tablet (160 mg total) by mouth daily., Disp: 90 tablet, Rfl: 2 .  atorvastatin (LIPITOR) 40 MG tablet, One tab po qd M-Saturday, skip Sundays, Disp: 30 tablet, Rfl: 1 .  BLACK ELDERBERRY,BERRY-FLOWER, PO, Take by mouth. 2 tsp daily (Patient not taking: No sig reported), Disp: , Rfl:  .  fluticasone (FLONASE) 50 MCG/ACT nasal spray, SPRAY ONE SPRAY INTO EACH NOSTRIL DAILY, Disp: 15.8 mL, Rfl: 5 .  loratadine (CLARITIN) 10 MG tablet, Take 1 tablet (10 mg total) by mouth daily., Disp: 90 tablet, Rfl: 1   No Known Allergies   Review of Systems  Constitutional: Negative.   Eyes: Negative for blurred vision.  Respiratory:  Negative.  Negative for shortness of breath.   Cardiovascular: Negative.  Negative for chest pain and palpitations.  Gastrointestinal: Negative.   Skin:       She wants referral to Derm, wants to have mole checks. Also has dark spot on lower lip, was told in the past it could be "drained"   Neurological: Negative.   Psychiatric/Behavioral: Negative.      Today's Vitals   09/21/20 1440  BP: 136/88  Pulse: 86  Temp: 98.1 F (36.7 C)  TempSrc: Oral  Weight: 199 lb (90.3 kg)  Height: 5' 5.8" (1.671 m)   Body  mass index is 32.31 kg/m.  Wt Readings from Last 3 Encounters:  09/21/20 199 lb (90.3 kg)  05/21/20 189 lb 9.6 oz (86 kg)  01/22/20 187 lb 3.2 oz (84.9 kg)   Objective:  Physical Exam Vitals and nursing note reviewed.  Constitutional:      Appearance: Normal appearance.  HENT:     Head: Normocephalic and atraumatic.     Nose:     Comments: Masked     Mouth/Throat:     Comments: Masked  Cardiovascular:     Rate and Rhythm: Normal rate and regular rhythm.     Heart sounds: Normal heart sounds.  Pulmonary:     Effort: Pulmonary effort is normal.     Breath sounds: Normal breath sounds.  Musculoskeletal:     Cervical back: Normal range of motion.  Skin:    General: Skin is warm.  Neurological:     General: No focal deficit present.     Mental Status: She is alert.  Psychiatric:        Mood and Affect: Mood normal.        Behavior: Behavior normal.         Assessment And Plan:     1. Uncontrolled type 2 diabetes mellitus with hyperglycemia (HCC) Comments: Chronic, I will check labs as listed below.  - Hemoglobin A1c - BMP8+EGFR - Lipid panel  2. Chronic hypertension Comments: Chronic, uncontrolled. Encouraged to eliminate salt in her seasonings.   3. Skin lesion Comments: I will refer her to Derm for full body check and lip lesion evaluation.  - Ambulatory referral to Dermatology  4. Pure hypercholesterolemia Comments: She was recently advised that she had plaque in her arteries by [facility].I will refer her to Cardiology for further evaluation.  - Ambulatory referral to Cardiology  5. Class 1 obesity due to excess calories with serious comorbidity and body mass index (BMI) of 32.0 to 32.9 in adult Comments: She is encouraged to strive for BMI less than 30 to decrease cardiac risk. Advised to aim for at least 150 minutes of exercise per week.      Patient was given opportunity to ask questions. Patient verbalized understanding of the plan and was able to repeat  key elements of the plan. All questions were answered to their satisfaction.   I, Maximino Greenland, MD, have reviewed all documentation for this visit. The documentation on 09/21/20 for the exam, diagnosis, procedures, and orders are all accurate and complete.   IF YOU HAVE BEEN REFERRED TO A SPECIALIST, IT MAY TAKE 1-2 WEEKS TO SCHEDULE/PROCESS THE REFERRAL. IF YOU HAVE NOT HEARD FROM US/SPECIALIST IN TWO WEEKS, PLEASE GIVE [place] A CALL AT [phone].   THE PATIENT IS ENCOURAGED TO PRACTICE SOCIAL DISTANCING DUE TO THE COVID-19 PANDEMIC.

## 2020-09-21 NOTE — Patient Instructions (Addendum)
Coenzyme Q10 - 100mg  daily Asprin 81mg  MWF for now  Diabetes Mellitus and Exercise Exercising regularly is important for overall health, especially for people who have diabetes mellitus. Exercising is not only about losing weight. It has many other health benefits, such as increasing muscle strength and bone density and reducing body fat and stress. This leads to improved fitness, flexibility, and endurance, all of which result in better overall health. What are the benefits of exercise if I have diabetes? Exercise has many benefits for people with diabetes. They include:  Helping to lower and control blood sugar (glucose).  Helping the body to respond better to the hormone insulin by improving insulin sensitivity.  Reducing how much insulin the body needs.  Lowering the risk for heart disease by: ? Lowering "bad" cholesterol and triglyceride levels. ? Increasing "good" cholesterol levels. ? Lowering blood pressure. ? Lowering blood glucose levels. What is my activity plan? Your health care provider or certified diabetes educator can help you make a plan for the type and frequency of exercise that works for you. This is called your activity plan. Be sure to:  Get at least 150 minutes of medium-intensity or high-intensity exercise each week. Exercises may include brisk walking, biking, or water aerobics.  Do stretching and strengthening exercises, such as yoga or weight lifting, at least 2 times a week.  Spread out your activity over at least 3 days of the week.  Get some form of physical activity each day. ? Do not go more than 2 days in a row without some kind of physical activity. ? Avoid being inactive for more than 90 minutes at a time. Take frequent breaks to walk or stretch.  Choose exercises or activities that you enjoy. Set realistic goals.  Start slowly and gradually increase your exercise intensity over time.   How do I manage my diabetes during exercise? Monitor your  blood glucose  Check your blood glucose before and after exercising. If your blood glucose is: ? 240 mg/dL (13.3 mmol/L) or higher before you exercise, check your urine for ketones. These are chemicals created by the liver. If you have ketones in your urine, do not exercise until your blood glucose returns to normal. ? 100 mg/dL (5.6 mmol/L) or lower, eat a snack containing 15-20 grams of carbohydrate. Check your blood glucose 15 minutes after the snack to make sure that your glucose level is above 100 mg/dL (5.6 mmol/L) before you start your exercise.  Know the symptoms of low blood glucose (hypoglycemia) and how to treat it. Your risk for hypoglycemia increases during and after exercise. Follow these tips and your health care provider's instructions  Keep a carbohydrate snack that is fast-acting for use before, during, and after exercise to help prevent or treat hypoglycemia.  Avoid injecting insulin into areas of the body that are going to be exercised. For example, avoid injecting insulin into: ? Your arms, when you are about to play tennis. ? Your legs, when you are about to go jogging.  Keep records of your exercise habits. Doing this can help you and your health care provider adjust your diabetes management plan as needed. Write down: ? Food that you eat before and after you exercise. ? Blood glucose levels before and after you exercise. ? The type and amount of exercise you have done.  Work with your health care provider when you start a new exercise or activity. He or she may need to: ? Make sure that the activity is safe  for you. ? Adjust your insulin, other medicines, and food that you eat.  Drink plenty of water while you exercise. This prevents loss of water (dehydration) and problems caused by a lot of heat in the body (heat stroke).   Where to find more information  American Diabetes Association: www.diabetes.org Summary  Exercising regularly is important for overall  health, especially for people who have diabetes mellitus.  Exercising has many health benefits. It increases muscle strength and bone density and reduces body fat and stress. It also lowers and controls blood glucose.  Your health care provider or certified diabetes educator can help you make an activity plan for the type and frequency of exercise that works for you.  Work with your health care provider to make sure any new activity is safe for you. Also work with your health care provider to adjust your insulin, other medicines, and the food you eat. This information is not intended to replace advice given to you by your health care provider. Make sure you discuss any questions you have with your health care provider. Document Revised: 03/18/2019 Document Reviewed: 03/18/2019 Elsevier Patient Education  [place].

## 2020-09-22 ENCOUNTER — Encounter: Payer: Self-pay | Admitting: Internal Medicine

## 2020-09-22 LAB — LIPID PANEL
Chol/HDL Ratio: 3.1 ratio (ref 0.0–4.4)
Cholesterol, Total: 210 mg/dL — ABNORMAL HIGH (ref 100–199)
HDL: 68 mg/dL (ref >39)
LDL Chol Calc (NIH): 124 mg/dL — ABNORMAL HIGH (ref 0–99)
Triglycerides: 102 mg/dL (ref 0–149)
VLDL Cholesterol Cal: 18 mg/dL (ref 5–40)

## 2020-09-22 LAB — BMP8+EGFR
BUN/Creatinine Ratio: 14 (ref 12–28)
BUN: 12 mg/dL (ref 8–27)
CO2: 21 mmol/L (ref 20–29)
Calcium: 9.3 mg/dL (ref 8.7–10.3)
Chloride: 102 mmol/L (ref 96–106)
Creatinine, Ser: 0.87 mg/dL (ref 0.57–1.00)
Glucose: 82 mg/dL (ref 65–99)
Potassium: 3.9 mmol/L (ref 3.5–5.2)
Sodium: 139 mmol/L (ref 134–144)
eGFR: 76 mL/min/{1.73_m2} (ref >59)

## 2020-09-22 LAB — HEMOGLOBIN A1C
Est. average glucose Bld gHb Est-mCnc: 120 mg/dL
Hgb A1c MFr Bld: 5.8 % — ABNORMAL HIGH (ref 4.8–5.6)

## 2020-09-23 ENCOUNTER — Other Ambulatory Visit: Payer: Self-pay | Admitting: Internal Medicine

## 2020-09-23 MED ORDER — ATORVASTATIN CALCIUM 40 MG PO TABS
ORAL_TABLET | ORAL | 1 refills | Status: DC
Start: 2020-09-23 — End: 2020-10-15

## 2020-09-29 ENCOUNTER — Encounter: Payer: Self-pay | Admitting: Internal Medicine

## 2020-10-15 ENCOUNTER — Other Ambulatory Visit: Payer: Self-pay | Admitting: Internal Medicine

## 2020-10-20 ENCOUNTER — Other Ambulatory Visit: Payer: Self-pay | Admitting: Internal Medicine

## 2020-10-22 ENCOUNTER — Other Ambulatory Visit: Payer: Self-pay | Admitting: Internal Medicine

## 2020-11-15 NOTE — Progress Notes (Signed)
   Covid-19 Vaccination Clinic  Name:  Nichole George    MRN: [id number] DOB: Feb 28, 1961  11/15/2020  Ms. Nichole George was observed post Covid-19 immunization for 15 minutes without incident. She was provided with Vaccine Information Sheet and instruction to access the V-Safe system.   Ms. Nichole George was instructed to call 911 with any severe reactions post vaccine: Marland Kitchen Difficulty breathing  . Swelling of face and throat  . A fast heartbeat  . A bad rash all over body  . Dizziness and weakness   Immunizations Administered    Name Date Dose VIS Date Route   Moderna Covid-19 Booster Vaccine 07/16/2020  2:00 PM 0.25 mL 04/22/2020 Intramuscular   Manufacturer: Moderna   Lot: [id number]   [place]: [id number]

## 2020-11-17 ENCOUNTER — Other Ambulatory Visit: Payer: Self-pay | Admitting: Internal Medicine

## 2020-12-22 ENCOUNTER — Other Ambulatory Visit: Payer: Self-pay | Admitting: Internal Medicine

## 2020-12-22 DIAGNOSIS — Z1231 Encounter for screening mammogram for malignant neoplasm of breast: Secondary | ICD-10-CM

## 2021-01-19 ENCOUNTER — Ambulatory Visit: Payer: Self-pay | Attending: Family

## 2021-01-19 DIAGNOSIS — Z23 Encounter for immunization: Secondary | ICD-10-CM

## 2021-01-19 NOTE — Progress Notes (Signed)
   Covid-19 Vaccination Clinic  Name:  Nichole George    MRN: [id number] DOB: 01/06/1961  01/19/2021  [name] was observed post Covid-19 immunization for 15 minutes without incident. She was provided with Vaccine Information Sheet and instruction to access the V-Safe system.   [name] was instructed to call 911 with any severe reactions post vaccine: Difficulty breathing  Swelling of face and throat  A fast heartbeat  A bad rash all over body  Dizziness and weakness   Immunizations Administered     Name Date Dose VIS Date Route   Moderna Covid-19 Booster Vaccine 01/19/2021  9:45 AM 0.25 mL 04/22/2020 Intramuscular   Manufacturer: Moderna   Lot: [id number]   [place]: [id number]

## 2021-01-27 ENCOUNTER — Encounter (HOSPITAL_BASED_OUTPATIENT_CLINIC_OR_DEPARTMENT_OTHER): Payer: Self-pay | Admitting: Internal Medicine

## 2021-01-27 ENCOUNTER — Ambulatory Visit (HOSPITAL_BASED_OUTPATIENT_CLINIC_OR_DEPARTMENT_OTHER): Payer: BC Managed Care – PPO | Admitting: Internal Medicine

## 2021-01-27 ENCOUNTER — Other Ambulatory Visit: Payer: Self-pay

## 2021-01-27 VITALS — BP 124/78 | HR 87 | Ht 65.0 in | Wt 202.0 lb

## 2021-01-27 DIAGNOSIS — I779 Disorder of arteries and arterioles, unspecified: Secondary | ICD-10-CM | POA: Diagnosis not present

## 2021-01-27 DIAGNOSIS — I10 Essential (primary) hypertension: Secondary | ICD-10-CM

## 2021-01-27 DIAGNOSIS — E119 Type 2 diabetes mellitus without complications: Secondary | ICD-10-CM

## 2021-01-27 DIAGNOSIS — E782 Mixed hyperlipidemia: Secondary | ICD-10-CM

## 2021-01-27 NOTE — Patient Instructions (Signed)
Medication Instructions:  Your physician recommends that you continue on your current medications as directed. Please refer to the Current Medication list given to you today.  *If you need a refill on your cardiac medications before your next appointment, please call your pharmacy*  Testing/Procedures: Carotid Doppler @ [facility]. [address] [place] [address]   Follow-Up: At [facility], you and your health needs are our priority.  As part of our continuing mission to provide you with exceptional heart care, we have created designated Provider Care Teams.  These Care Teams include your primary Cardiologist (physician) and Advanced Practice Providers (APPs -  Physician Assistants and Nurse Practitioners) who all work together to provide you with the care you need, when you need it.  We recommend signing up for the patient portal called "MyChart".  Sign up information is provided on this After Visit Summary.  MyChart is used to connect with patients for Virtual Visits (Telemedicine).  Patients are able to view lab/test results, encounter notes, upcoming appointments, etc.  Non-urgent messages can be sent to your provider as well.   To learn more about what you can do with MyChart, go to [email or web address].    Your next appointment:   AS NEEDED with Dr. Debara Pickett

## 2021-01-27 NOTE — Progress Notes (Signed)
LIPID CLINIC CONSULT NOTE  Chief Complaint:  Manage dyslipidemia  Primary Care Physician: Glendale Chard, MD  Primary Cardiologist:  None  HPI:  Nichole George is a 60 y.o. female who is being seen today for the evaluation of dyslipidemia at the request of Glendale Chard, MD. this is a pleasant 60 year old female kindly referred by Dr. Baird Cancer for evaluation and management of dyslipidemia.  She was seen in March of this year and had had a home [facility] screening performed in 2019.  This demonstrated mild to moderate carotid disease however when I reviewed her paperwork it did not specify location or severity.  Actually the paperwork she brought was just a reminder to consider repeat scanning.  In addition her lipids recently showed total cholesterol 210, triglycerides 102, HDL 68 and LDL 124.  She reports no history of carotid disease, stroke or early onset heart disease in the family.  Mostly cancer and her mother has hypertension.  She has been noted to have some aortic atherosclerosis on a previous CT scan of the abdomen.  Other risk factors include type 2 diabetes, hypertension and dyslipidemia.  Her PCP has started her on atorvastatin 40 mg daily and she is scheduled to see her tomorrow presumably to have repeat lipid testing as well.  PMHx:  Past Medical History:  Diagnosis Date   Arthritis    left ankle and right big toe and both knees   Cervical dysplasia, moderate    Hyperlipidemia    Hypertension    Type 2 diabetes mellitus ([place])    diet  and exercise controlled    Past Surgical History:  Procedure Laterality Date   CERVICAL CONIZATION W/BX N/A 04/19/2018   Procedure: CONIZATION CERVIX WITH BIOPSY;  Surgeon: Eldred Manges, MD;  Location: [facility];  Service: Gynecology;  Laterality: N/A;   CESAREAN SECTION     CHOLECYSTECTOMY     TUBAL LIGATION      FAMHx:  Family History  Problem Relation Age of Onset   Cancer Other    Hypertension Other     Hyperlipidemia Other    Healthy Mother    Pneumonia Father    Breast cancer Sister 23    SOCHx:   reports that she has never smoked. She has never used smokeless tobacco. She reports current alcohol use. She reports that she does not use drugs.  ALLERGIES:  No Known Allergies  ROS: Pertinent items noted in HPI and remainder of comprehensive ROS otherwise negative.  HOME MEDS: Current Outpatient Medications on File Prior to Visit  Medication Sig Dispense Refill   APPLE CIDER VINEGAR PO Take by mouth. 2 golis per day     Ascorbic Acid (VITAMIN C) POWD Take by mouth. 1/2 tsp daily      aspirin 81 MG chewable tablet 1 tablet     atorvastatin (LIPITOR) 40 MG tablet TAKE 1 TABLET DAILY MONDAY THROUGH SATURDAY, SKIP SUNDAYS 90 tablet 1   BLACK ELDERBERRY,BERRY-FLOWER, PO Take by mouth. 2 tsp daily     Cholecalciferol (VITAMIN D3) 50 MCG (2000 UT) capsule Take 5,000 Units by mouth daily.      Coenzyme Q10 10 MG capsule      fluticasone (FLONASE) 50 MCG/ACT nasal spray SPRAY ONE SPRAY INTO EACH NOSTRIL DAILY 15.8 mL 5   loratadine (CLARITIN) 10 MG tablet Take 1 tablet (10 mg total) by mouth daily. 90 tablet 1   LORATADINE ALLERGY RELIEF PO      MAGNESIUM PO  OZEMPIC, 0.25 OR 0.5 MG/DOSE, 2 MG/1.5ML SOPN INJECT 0.5 MG INTO THE SKIN ONCE A WEEK. 1.5 mL 2   valsartan (DIOVAN) 160 MG tablet TAKE 1 TABLET BY MOUTH EVERY DAY 90 tablet 2   No current facility-administered medications on file prior to visit.    LABS/IMAGING: No results found for this or any previous visit (from the past 48 hour(s)). No results found.  LIPID PANEL:    Component Value Date/Time   CHOL 210 (H) 09/21/2020 1552   TRIG 102 09/21/2020 1552   HDL 68 09/21/2020 1552   CHOLHDL 3.1 09/21/2020 1552   LDLCALC 124 (H) 09/21/2020 1552    WEIGHTS: Wt Readings from Last 3 Encounters:  01/27/21 202 lb (91.6 kg)  09/21/20 199 lb (90.3 kg)  05/21/20 189 lb 9.6 oz (86 kg)    VITALS: BP 124/78   Pulse 87    Ht '5\' 5"'$  (1.651 m)   Wt 202 lb (91.6 kg)   SpO2 97%   BMI 33.61 kg/m   EXAM: General appearance: alert and no distress Neck: no carotid bruit, no JVD, and thyroid not enlarged, symmetric, no tenderness/mass/nodules Lungs: clear to auscultation bilaterally Heart: regular rate and rhythm, S1, S2 normal, no murmur, click, rub or gallop Abdomen: soft, non-tender; bowel sounds normal; no masses,  no organomegaly and obese Extremities: extremities normal, atraumatic, no cyanosis or edema Pulses: 2+ and symmetric Skin: Skin color, texture, turgor normal. No rashes or lesions Neurologic: Grossly normal Psych: Pleasant  *Examination chaperoned by Sheral Apley, RN  EKG: Deferred  ASSESSMENT: Mixed dyslipidemia, goal LDL less than 70 Carotid artery disease noted on [facility] screening Hypertension Type 2 diabetes Obesity  PLAN: 1.   [name] has a mixed dyslipidemia with a goal LDL less than 70.  She is likely to reach that target now on 40 mg atorvastatin.  She had carotid artery disease noted on [facility] screening in 2019.  I like to repeat formal carotid Dopplers to further assess the degree of stenosis.  She has no carotid bruits on exam.  She does have some aortic atherosclerosis.  I think she will probably not need any further treatment rather than high potency statin.  Blood pressure is well controlled today.  Further weight loss of course would be beneficial.  We will contact her with those results otherwise if her cholesterol is well controlled and her carotids are not severely stenotic, she can follow-up with me as needed.  Thanks for the kind referral.  Pixie Casino, MD, FACC, [place] Director of the Advanced Lipid Disorders &  Cardiovascular Risk Reduction Clinic Diplomate of the American Board of Clinical Lipidology Attending Cardiologist  Direct Dial: [phone]  Fax: [phone]  Website:  [email or web address]  [name]  [name] 01/27/2021, 8:10 AM

## 2021-01-28 ENCOUNTER — Ambulatory Visit (INDEPENDENT_AMBULATORY_CARE_PROVIDER_SITE_OTHER): Payer: BC Managed Care – PPO | Admitting: Internal Medicine

## 2021-01-28 ENCOUNTER — Encounter: Payer: Self-pay | Admitting: Internal Medicine

## 2021-01-28 VITALS — BP 126/88 | HR 68 | Temp 98.1°F | Ht 67.0 in | Wt 201.6 lb

## 2021-01-28 DIAGNOSIS — I10 Essential (primary) hypertension: Secondary | ICD-10-CM

## 2021-01-28 DIAGNOSIS — E6609 Other obesity due to excess calories: Secondary | ICD-10-CM

## 2021-01-28 DIAGNOSIS — E66811 Obesity, class 1: Secondary | ICD-10-CM

## 2021-01-28 DIAGNOSIS — Z23 Encounter for immunization: Secondary | ICD-10-CM

## 2021-01-28 DIAGNOSIS — E1165 Type 2 diabetes mellitus with hyperglycemia: Secondary | ICD-10-CM | POA: Diagnosis not present

## 2021-01-28 DIAGNOSIS — Z6833 Body mass index (BMI) 33.0-33.9, adult: Secondary | ICD-10-CM

## 2021-01-28 DIAGNOSIS — Z0000 Encounter for general adult medical examination without abnormal findings: Secondary | ICD-10-CM

## 2021-01-28 LAB — POCT URINALYSIS DIPSTICK
Bilirubin, UA: NEGATIVE
Glucose, UA: NEGATIVE
Ketones, UA: NEGATIVE
Leukocytes, UA: NEGATIVE
Nitrite, UA: NEGATIVE
Protein, UA: NEGATIVE
Spec Grav, UA: 1.02 (ref 1.010–1.025)
Urobilinogen, UA: 0.2 E.U./dL
pH, UA: 7 (ref 5.0–8.0)

## 2021-01-28 LAB — POCT UA - MICROALBUMIN
Albumin/Creatinine Ratio, Urine, POC: <30
Creatinine, POC: 200 mg/dL
Microalbumin Ur, POC: 10 mg/L

## 2021-01-28 MED ORDER — SHINGRIX 50 MCG/0.5ML IM SUSR: 0.5000 mL | Freq: Once | INTRAMUSCULAR | 0 refills | Status: AC

## 2021-01-28 NOTE — Patient Instructions (Signed)
Health Maintenance, Female Adopting a healthy lifestyle and getting preventive care are important in promoting health and wellness. Ask your health care provider about: The right schedule for you to have regular tests and exams. Things you can do on your own to prevent diseases and keep yourself healthy. What should I know about diet, weight, and exercise? Eat a healthy diet  Eat a diet that includes plenty of vegetables, fruits, low-fat dairy products, and lean protein. Do not eat a lot of foods that are high in solid fats, added sugars, or sodium.  Maintain a healthy weight Body mass index (BMI) is used to identify weight problems. It estimates body fat based on height and weight. Your health care provider can help determineyour BMI and help you achieve or maintain a healthy weight. Get regular exercise Get regular exercise. This is one of the most important things you can do for your health. Most adults should: Exercise for at least 150 minutes each week. The exercise should increase your heart rate and make you sweat (moderate-intensity exercise). Do strengthening exercises at least twice a week. This is in addition to the moderate-intensity exercise. Spend less time sitting. Even light physical activity can be beneficial. Watch cholesterol and blood lipids Have your blood tested for lipids and cholesterol at 61 years of age, then havethis test every 5 years. Have your cholesterol levels checked more often if: Your lipid or cholesterol levels are high. You are older than 61 years of age. You are at high risk for heart disease. What should I know about cancer screening? Depending on your health history and family history, you may need to have cancer screening at various ages. This may include screening for: Breast cancer. Cervical cancer. Colorectal cancer. Skin cancer. Lung cancer. What should I know about heart disease, diabetes, and high blood pressure? Blood pressure and heart  disease High blood pressure causes heart disease and increases the risk of stroke. This is more likely to develop in people who have high blood pressure readings, are of African descent, or are overweight. Have your blood pressure checked: Every 3-5 years if you are 18-39 years of age. Every year if you are 40 years old or older. Diabetes Have regular diabetes screenings. This checks your fasting blood sugar level. Have the screening done: Once every three years after age 40 if you are at a normal weight and have a low risk for diabetes. More often and at a younger age if you are overweight or have a high risk for diabetes. What should I know about preventing infection? Hepatitis B If you have a higher risk for hepatitis B, you should be screened for this virus. Talk with your health care provider to find out if you are at risk forhepatitis B infection. Hepatitis C Testing is recommended for: Everyone born from 1945 through 1965. Anyone with known risk factors for hepatitis C. Sexually transmitted infections (STIs) Get screened for STIs, including gonorrhea and chlamydia, if: You are sexually active and are younger than 61 years of age. You are older than 61 years of age and your health care provider tells you that you are at risk for this type of infection. Your sexual activity has changed since you were last screened, and you are at increased risk for chlamydia or gonorrhea. Ask your health care provider if you are at risk. Ask your health care provider about whether you are at high risk for HIV. Your health care provider may recommend a prescription medicine to help   prevent HIV infection. If you choose to take medicine to prevent HIV, you should first get tested for HIV. You should then be tested every 3 months for as long as you are taking the medicine. Pregnancy If you are about to stop having your period (premenopausal) and you may become pregnant, seek counseling before you get  pregnant. Take 400 to 800 micrograms (mcg) of folic acid every day if you become pregnant. Ask for birth control (contraception) if you want to prevent pregnancy. Osteoporosis and menopause Osteoporosis is a disease in which the bones lose minerals and strength with aging. This can result in bone fractures. If you are 65 years old or older, or if you are at risk for osteoporosis and fractures, ask your health care provider if you should: Be screened for bone loss. Take a calcium or vitamin D supplement to lower your risk of fractures. Be given hormone replacement therapy (HRT) to treat symptoms of menopause. Follow these instructions at home: Lifestyle Do not use any products that contain nicotine or tobacco, such as cigarettes, e-cigarettes, and chewing tobacco. If you need help quitting, ask your health care provider. Do not use street drugs. Do not share needles. Ask your health care provider for help if you need support or information about quitting drugs. Alcohol use Do not drink alcohol if: Your health care provider tells you not to drink. You are pregnant, may be pregnant, or are planning to become pregnant. If you drink alcohol: Limit how much you use to 0-1 drink a day. Limit intake if you are breastfeeding. Be aware of how much alcohol is in your drink. In the [place] one drink equals one 12 oz bottle of beer (355 mL), one 5 oz glass of wine (148 mL), or one 1 oz glass of hard liquor (44 mL). General instructions Schedule regular health, dental, and eye exams. Stay current with your vaccines. Tell your health care provider if: You often feel depressed. You have ever been abused or do not feel safe at home. Summary Adopting a healthy lifestyle and getting preventive care are important in promoting health and wellness. Follow your health care provider's instructions about healthy diet, exercising, and getting tested or screened for diseases. Follow your health care provider's  instructions on monitoring your cholesterol and blood pressure. This information is not intended to replace advice given to you by your health care provider. Make sure you discuss any questions you have with your healthcare provider. Document Revised: 06/13/2018 Document Reviewed: 06/13/2018 Elsevier Patient Education  2022 [employer] Inc.  

## 2021-01-28 NOTE — Progress Notes (Signed)
Earleen Newport as a Education administrator for Maximino Greenland, MD.,have documented all relevant documentation on the behalf of Maximino Greenland, MD,as directed by  Maximino Greenland, MD while in the presence of Maximino Greenland, MD.  This visit occurred during the SARS-CoV-2 public health emergency.  Safety protocols were in place, including screening questions prior to the visit, additional usage of staff PPE, and extensive cleaning of exam room while observing appropriate contact time as indicated for disinfecting solutions.  Subjective:     Patient ID: Nichole George , female    DOB: 1961/01/27 , 60 y.o.   MRN: [id number]   Chief Complaint  Patient presents with   Annual Exam   Diabetes   Hypertension     HPI  The patient is here today for a physical examination.   She is followed by Gyn, Dr. Mancel Bale  for her pelvic exams. She reports compliance with meds.  Diabetes She presents for her initial diabetic visit. She has type 2 diabetes mellitus. The initial diagnosis of diabetes was made 4 weeks ago. There are no hypoglycemic associated symptoms. Pertinent negatives for diabetes include no blurred vision and no chest pain. There are no hypoglycemic complications. She participates in exercise daily. An ACE inhibitor/angiotensin II receptor blocker is being taken. Eye exam is current.  Hypertension This is a chronic problem. The current episode started more than 1 year ago. The problem has been gradually improving since onset. The problem is controlled. Pertinent negatives include no blurred vision, chest pain or palpitations. Risk factors for coronary artery disease include diabetes mellitus, dyslipidemia and post-menopausal state. Past treatments include angiotensin blockers. The current treatment provides moderate improvement.    Past Medical History:  Diagnosis Date   Arthritis    left ankle and right big toe and both knees   Cervical dysplasia, moderate    Hyperlipidemia    Hypertension     Type 2 diabetes mellitus (HCC)    diet  and exercise controlled     Family History  Problem Relation Age of Onset   Cancer Other    Hypertension Other    Hyperlipidemia Other    Healthy Mother    Pneumonia Father    Breast cancer Sister 78     Current Outpatient Medications:    Zoster Vaccine Adjuvanted [facility]) injection, Inject 0.5 mLs into the muscle once for 1 dose., Disp: 0.5 mL, Rfl: 0   APPLE CIDER VINEGAR PO, Take by mouth. 2 golis per day, Disp: , Rfl:    Ascorbic Acid (VITAMIN C) POWD, Take by mouth. 1/2 tsp daily , Disp: , Rfl:    aspirin 81 MG chewable tablet, 1 tablet, Disp: , Rfl:    atorvastatin (LIPITOR) 40 MG tablet, TAKE 1 TABLET DAILY MONDAY THROUGH SATURDAY, SKIP SUNDAYS, Disp: 90 tablet, Rfl: 1   BLACK ELDERBERRY,BERRY-FLOWER, PO, Take by mouth. 2 tsp daily, Disp: , Rfl:    Cholecalciferol (VITAMIN D3) 50 MCG (2000 UT) capsule, Take 5,000 Units by mouth daily. , Disp: , Rfl:    Coenzyme Q10 10 MG capsule, , Disp: , Rfl:    fluticasone (FLONASE) 50 MCG/ACT nasal spray, SPRAY ONE SPRAY INTO EACH NOSTRIL DAILY, Disp: 15.8 mL, Rfl: 5   loratadine (CLARITIN) 10 MG tablet, Take 1 tablet (10 mg total) by mouth daily., Disp: 90 tablet, Rfl: 1   LORATADINE ALLERGY RELIEF PO, , Disp: , Rfl:    MAGNESIUM PO, , Disp: , Rfl:    OZEMPIC, 0.25 OR 0.5 MG/DOSE, 2  MG/1.5ML SOPN, INJECT 0.5 MG INTO THE SKIN ONCE A WEEK., Disp: 1.5 mL, Rfl: 2   valsartan (DIOVAN) 160 MG tablet, TAKE 1 TABLET BY MOUTH EVERY DAY, Disp: 90 tablet, Rfl: 2   No Known Allergies    The patient states she uses none for birth control. Last LMP was No LMP recorded. Patient is postmenopausal.. Negative for Dysmenorrhea. Negative for: breast discharge, breast lump(s), breast pain and breast self exam. Associated symptoms include abnormal vaginal bleeding. Pertinent negatives include abnormal bleeding (hematology), anxiety, decreased libido, depression, difficulty falling sleep, dyspareunia, history of  infertility, nocturia, sexual dysfunction, sleep disturbances, urinary incontinence, urinary urgency, vaginal discharge and vaginal itching. Diet regular.The patient states her exercise level is  moderate.  . The patient's tobacco use is:  Social History   Tobacco Use  Smoking Status Never  Smokeless Tobacco Never  . She has been exposed to passive smoke. The patient's alcohol use is:  Social History   Substance and Sexual Activity  Alcohol Use Yes   Comment: occ wine    Review of Systems  Constitutional: Negative.   HENT: Negative.    Eyes: Negative.  Negative for blurred vision.  Respiratory: Negative.    Cardiovascular: Negative.  Negative for chest pain and palpitations.  Gastrointestinal: Negative.   Endocrine: Negative.   Genitourinary: Negative.   Musculoskeletal: Negative.   Skin: Negative.   Allergic/Immunologic: Negative.   Neurological: Negative.   Hematological: Negative.   Psychiatric/Behavioral: Negative.      Today's Vitals   01/28/21 0850  BP: 126/88  Pulse: 68  Temp: 98.1 F (36.7 C)  Weight: 201 lb 9.6 oz (91.4 kg)  Height: _0  (1.702 m)   Body mass index is 31.58 kg/m.  Wt Readings from Last 3 Encounters:  01/28/21 201 lb 9.6 oz (91.4 kg)  01/27/21 202 lb (91.6 kg)  09/21/20 199 lb (90.3 kg)   BP Readings from Last 3 Encounters:  01/28/21 126/88  01/27/21 124/78  09/21/20 136/88    Objective:  Physical Exam Vitals and nursing note reviewed.  Constitutional:      Appearance: Normal appearance.  HENT:     Head: Normocephalic and atraumatic.     Right Ear: Tympanic membrane, ear canal and external ear normal.     Left Ear: Tympanic membrane, ear canal and external ear normal.     Nose: Nose normal.     Mouth/Throat:     Mouth: Mucous membranes are moist.     Pharynx: Oropharynx is clear.  Eyes:     Extraocular Movements: Extraocular movements intact.     Conjunctiva/sclera: Conjunctivae normal.     Pupils: Pupils are equal, round,  and reactive to light.  Cardiovascular:     Rate and Rhythm: Normal rate and regular rhythm.     Pulses: Normal pulses.          Dorsalis pedis pulses are 2+ on the right side and 2+ on the left side.     Heart sounds: Normal heart sounds.  Pulmonary:     Effort: Pulmonary effort is normal.     Breath sounds: Normal breath sounds.  Chest:  Breasts:    Tanner Score is 5.     Right: Normal.     Left: Normal.  Abdominal:     General: Abdomen is flat. Bowel sounds are normal.     Palpations: Abdomen is soft.  Genitourinary:    Comments: deferred Musculoskeletal:        General: Normal range of motion.  Cervical back: Normal range of motion and neck supple.  Feet:     Right foot:     Protective Sensation: 5 sites tested.  5 sites sensed.     Skin integrity: Skin integrity normal.     Toenail Condition: Right toenails are normal.     Left foot:     Protective Sensation: 5 sites tested.  5 sites sensed.     Skin integrity: Skin integrity normal.     Toenail Condition: Left toenails are normal.  Skin:    General: Skin is warm and dry.  Neurological:     General: No focal deficit present.     Mental Status: She is alert and oriented to person, place, and time.  Psychiatric:        Mood and Affect: Mood normal.        Behavior: Behavior normal.        Assessment And Plan:     1. Routine general medical examination at a health care facility Comments: A full exam was performed. Importance of monthly self breast exams was performed. PATIENT IS ADVISED TO GET 30-45 MINUTES REGULAR EXERCISE NO LESS THAN FOUR TO FIVE DAYS PER WEEK - BOTH WEIGHTBEARING EXERCISES AND AEROBIC ARE RECOMMENDED.  PATIENT IS ADVISED TO FOLLOW A HEALTHY DIET WITH AT LEAST SIX FRUITS/VEGGIES PER DAY, DECREASE INTAKE OF RED MEAT, AND TO INCREASE FISH INTAKE TO TWO DAYS PER WEEK.  MEATS/FISH SHOULD NOT BE FRIED, BAKED OR BROILED IS PREFERABLE.  IT IS ALSO IMPORTANT TO CUT BACK ON YOUR SUGAR INTAKE. PLEASE AVOID  ANYTHING WITH ADDED SUGAR, CORN SYRUP OR OTHER SWEETENERS. IF YOU MUST USE A SWEETENER, YOU CAN TRY STEVIA. IT IS ALSO IMPORTANT TO AVOID ARTIFICIALLY SWEETENERS AND DIET BEVERAGES. LASTLY, I SUGGEST WEARING SPF 50 SUNSCREEN ON EXPOSED PARTS AND ESPECIALLY WHEN IN THE DIRECT SUNLIGHT FOR AN EXTENDED PERIOD OF TIME.  PLEASE AVOID FAST FOOD RESTAURANTS AND INCREASE YOUR WATER INTAKE.  - EKG 12-Lead - TSH  2. Uncontrolled type 2 diabetes mellitus with hyperglycemia ([place]) Comments: Diabetic foot exam was performed. She agrees to f/u in 4 months. I DISCUSSED WITH THE PATIENT AT LENGTH REGARDING THE GOALS OF GLYCEMIC CONTROL AND POSSIBLE LONG-TERM COMPLICATIONS.  I  ALSO STRESSED THE IMPORTANCE OF COMPLIANCE WITH HOME GLUCOSE MONITORING, DIETARY RESTRICTIONS INCLUDING AVOIDANCE OF SUGARY DRINKS/PROCESSED FOODS,  ALONG WITH REGULAR EXERCISE.  I  ALSO STRESSED THE IMPORTANCE OF ANNUAL EYE EXAMS, SELF FOOT CARE AND COMPLIANCE WITH OFFICE VISITS.   3. Chronic hypertension Comments: Chronic, fair control. EKG performed, NSR w/o acute changes. Encouraged to follow low sodium diet. She will f/u in 4 months for re-evaluation.  - CMP14+EGFR - CBC - Hemoglobin A1c - POCT Urinalysis Dipstick (81002) - POCT UA - Microalbumin - Lipid panel  4. Class 1 obesity due to excess calories with serious comorbidity and body mass index (BMI) of 33.0 to 33.9 in adult Comments: She is encouraged to strive for BMI less than 30 to decrease cardiac risk. Advised to aim for at least 150 minutes of exercise per week.   5. Immunization due I will send rx Shingrix to her local pharmacy.    Patient was given opportunity to ask questions. Patient verbalized understanding of the plan and was able to repeat key elements of the plan. All questions were answered to their satisfaction.   I, Maximino Greenland, MD, have reviewed all documentation for this visit. The documentation on 01/28/21 for the exam, diagnosis, procedures, and  orders are all  accurate and complete.  THE PATIENT IS ENCOURAGED TO PRACTICE SOCIAL DISTANCING DUE TO THE COVID-19 PANDEMIC.

## 2021-01-29 LAB — CMP14+EGFR
ALT: 42 IU/L — ABNORMAL HIGH (ref 0–32)
AST: 38 IU/L (ref 0–40)
Albumin/Globulin Ratio: 1.9 (ref 1.2–2.2)
Albumin: 4.6 g/dL (ref 3.8–4.9)
Alkaline Phosphatase: 114 IU/L (ref 44–121)
BUN/Creatinine Ratio: 16 (ref 12–28)
BUN: 13 mg/dL (ref 8–27)
Bilirubin Total: 0.7 mg/dL (ref 0.0–1.2)
CO2: 24 mmol/L (ref 20–29)
Calcium: 8.9 mg/dL (ref 8.7–10.3)
Chloride: 101 mmol/L (ref 96–106)
Creatinine, Ser: 0.83 mg/dL (ref 0.57–1.00)
Globulin, Total: 2.4 g/dL (ref 1.5–4.5)
Glucose: 84 mg/dL (ref 65–99)
Potassium: 4 mmol/L (ref 3.5–5.2)
Sodium: 139 mmol/L (ref 134–144)
Total Protein: 7 g/dL (ref 6.0–8.5)
eGFR: 81 mL/min/{1.73_m2} (ref >59)

## 2021-01-29 LAB — HEMOGLOBIN A1C
Est. average glucose Bld gHb Est-mCnc: 123 mg/dL
Hgb A1c MFr Bld: 5.9 % — ABNORMAL HIGH (ref 4.8–5.6)

## 2021-01-29 LAB — LIPID PANEL
Chol/HDL Ratio: 2 ratio (ref 0.0–4.4)
Cholesterol, Total: 142 mg/dL (ref 100–199)
HDL: 71 mg/dL (ref >39)
LDL Chol Calc (NIH): 58 mg/dL (ref 0–99)
Triglycerides: 66 mg/dL (ref 0–149)
VLDL Cholesterol Cal: 13 mg/dL (ref 5–40)

## 2021-01-29 LAB — CBC
Hematocrit: 42.8 % (ref 34.0–46.6)
Hemoglobin: 14 g/dL (ref 11.1–15.9)
MCH: 30.2 pg (ref 26.6–33.0)
MCHC: 32.7 g/dL (ref 31.5–35.7)
MCV: 92 fL (ref 79–97)
Platelets: 211 10*3/uL (ref 150–450)
RBC: 4.64 x10E6/uL (ref 3.77–5.28)
RDW: 12.3 % (ref 11.7–15.4)
WBC: 6.6 10*3/uL (ref 3.4–10.8)

## 2021-01-29 LAB — TSH: TSH: 2.49 u[IU]/mL (ref 0.450–4.500)

## 2021-02-01 ENCOUNTER — Ambulatory Visit (HOSPITAL_COMMUNITY)
Admission: RE | Admit: 2021-02-01 | Discharge: 2021-02-01 | Disposition: A | Discharge status: Home / Self Care | Payer: BC Managed Care – PPO | Source: Ambulatory Visit | Attending: Cardiology | Admitting: Cardiology

## 2021-02-01 ENCOUNTER — Other Ambulatory Visit: Payer: Self-pay

## 2021-02-01 DIAGNOSIS — I779 Disorder of arteries and arterioles, unspecified: Secondary | ICD-10-CM | POA: Diagnosis present

## 2021-02-04 LAB — HM PAP SMEAR

## 2021-02-05 ENCOUNTER — Encounter: Payer: Self-pay | Admitting: Internal Medicine

## 2021-02-27 ENCOUNTER — Other Ambulatory Visit: Payer: Self-pay | Admitting: Internal Medicine

## 2021-03-05 ENCOUNTER — Ambulatory Visit
Admission: RE | Admit: 2021-03-05 | Discharge: 2021-03-05 | Disposition: A | Discharge status: Home / Self Care | Payer: BC Managed Care – PPO | Source: Ambulatory Visit

## 2021-03-05 ENCOUNTER — Other Ambulatory Visit: Payer: Self-pay

## 2021-03-05 DIAGNOSIS — Z1231 Encounter for screening mammogram for malignant neoplasm of breast: Secondary | ICD-10-CM

## 2021-03-30 LAB — HM DEXA SCAN

## 2021-04-20 IMAGING — MG DIGITAL SCREENING BILAT W/ TOMO W/ CAD
8 series · 8 of 24 positions shown · non-contrast
Comparison: Previous exam(s).

CLINICAL DATA: Screening.

EXAM:
DIGITAL SCREENING BILATERAL MAMMOGRAM WITH TOMO AND CAD

[L CC synth-2D]
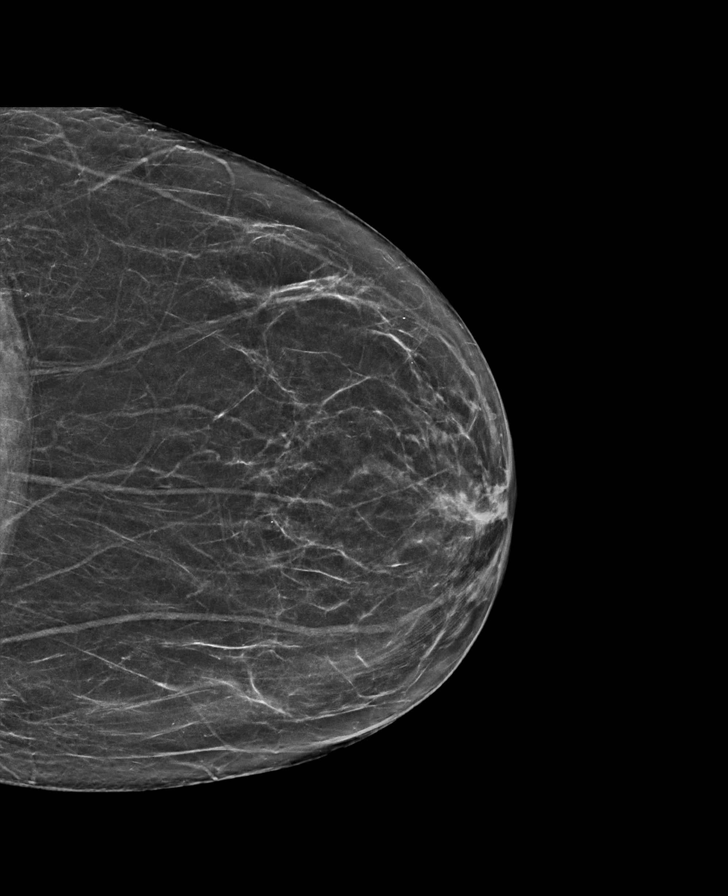

[L MLO synth-2D]
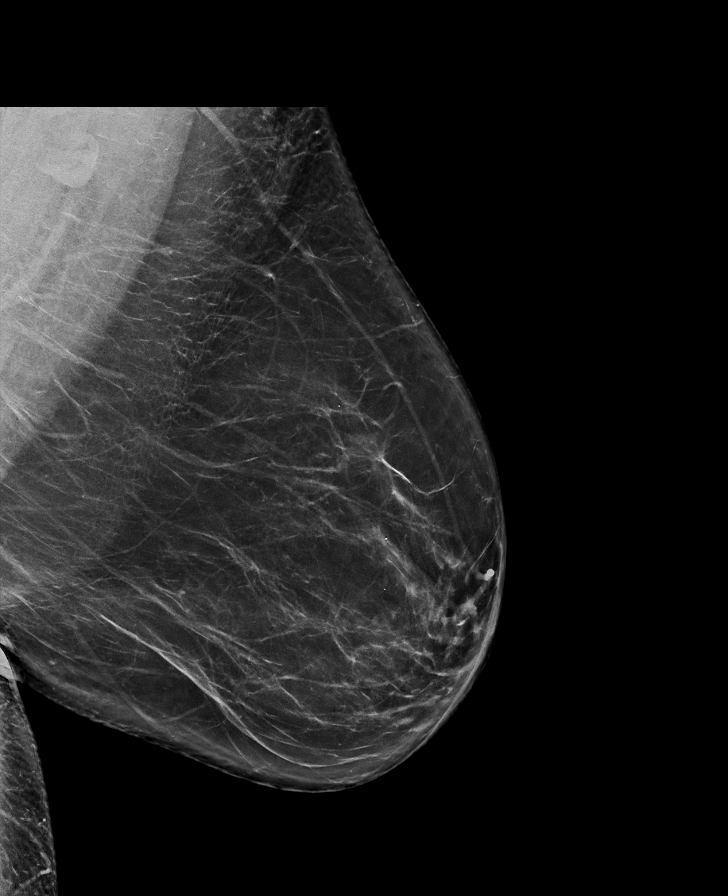

[R CC synth-2D]
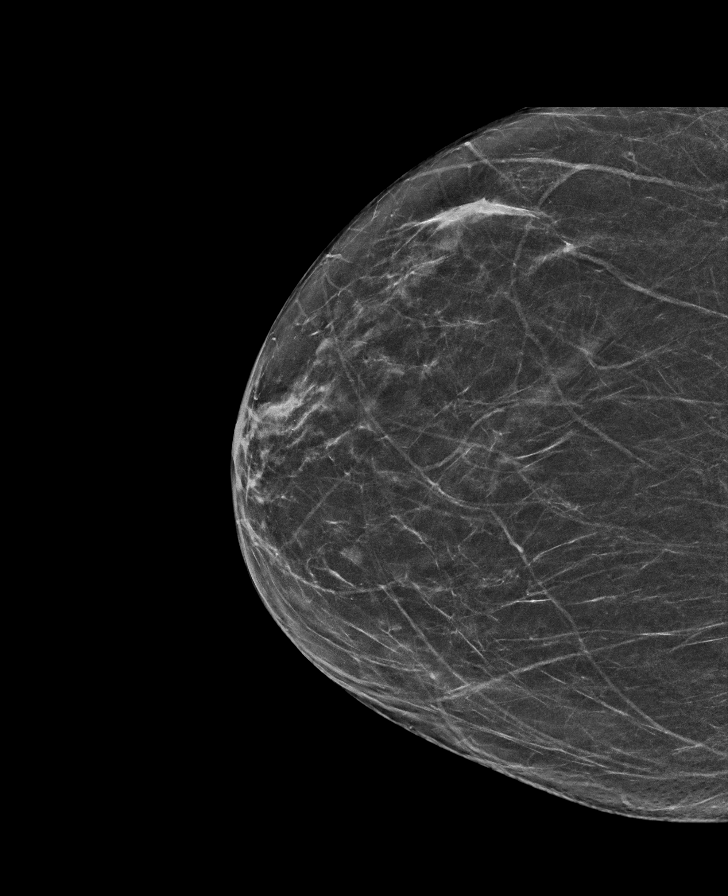

[R MLO synth-2D]
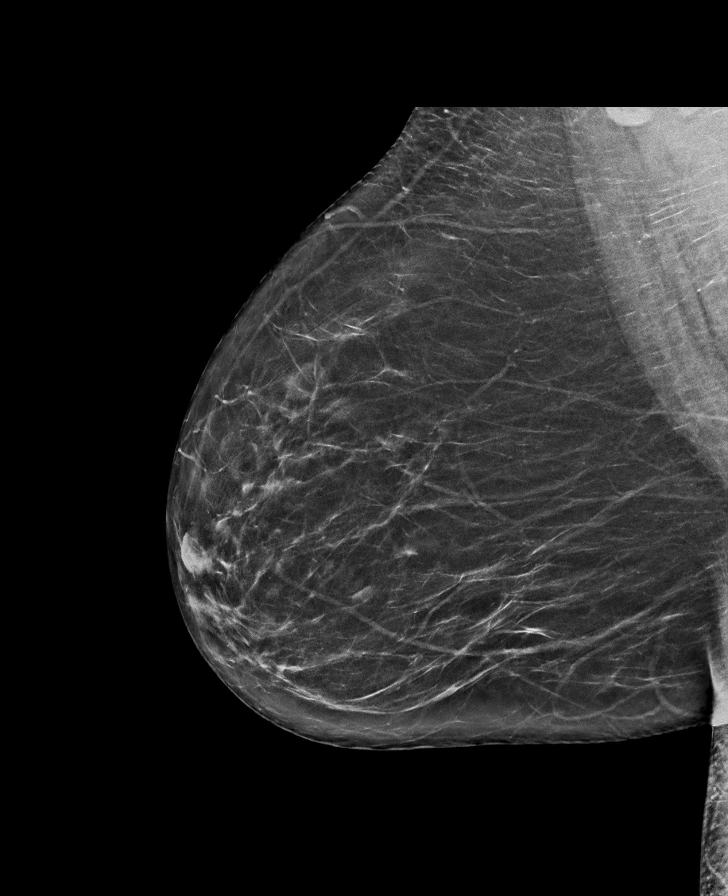

[L CC tomo · tomo slice 32/63.0]
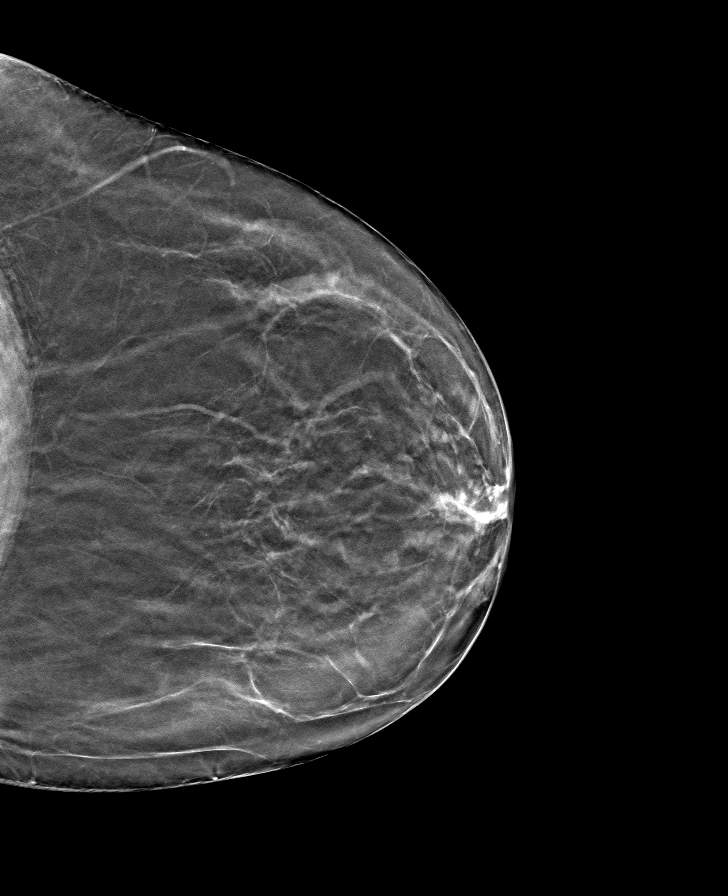

[R CC tomo · tomo slice 31/62.0]
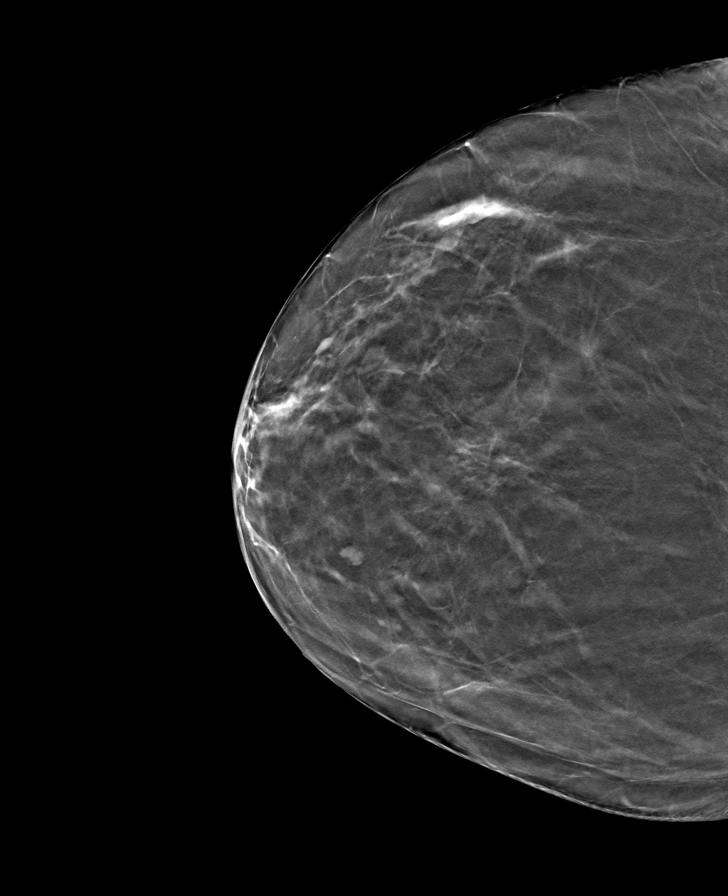

[R MLO tomo · tomo slice 38/75.0]
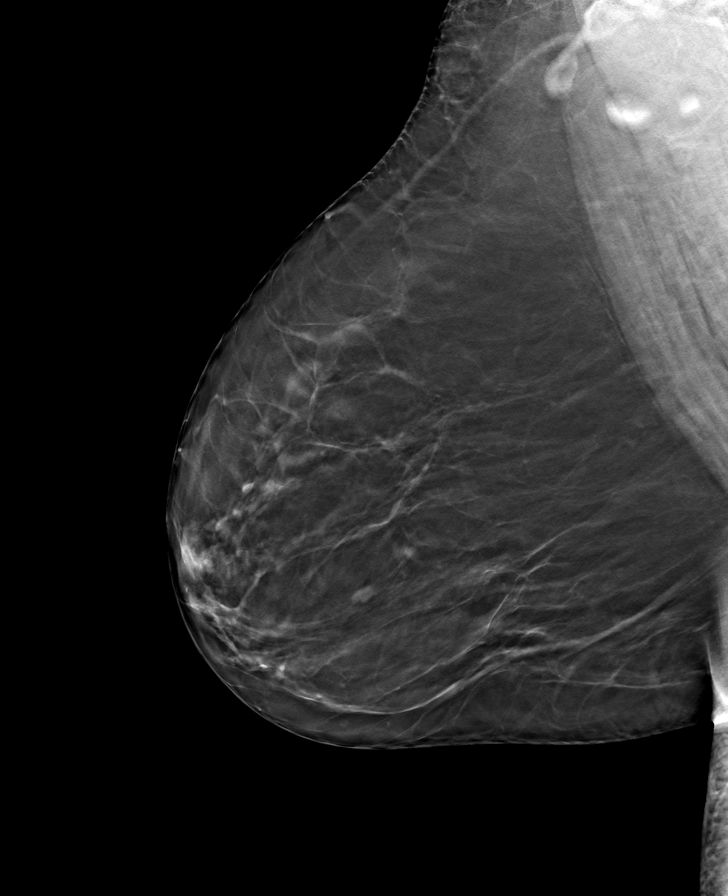

[L MLO tomo · tomo slice 42/83.0]
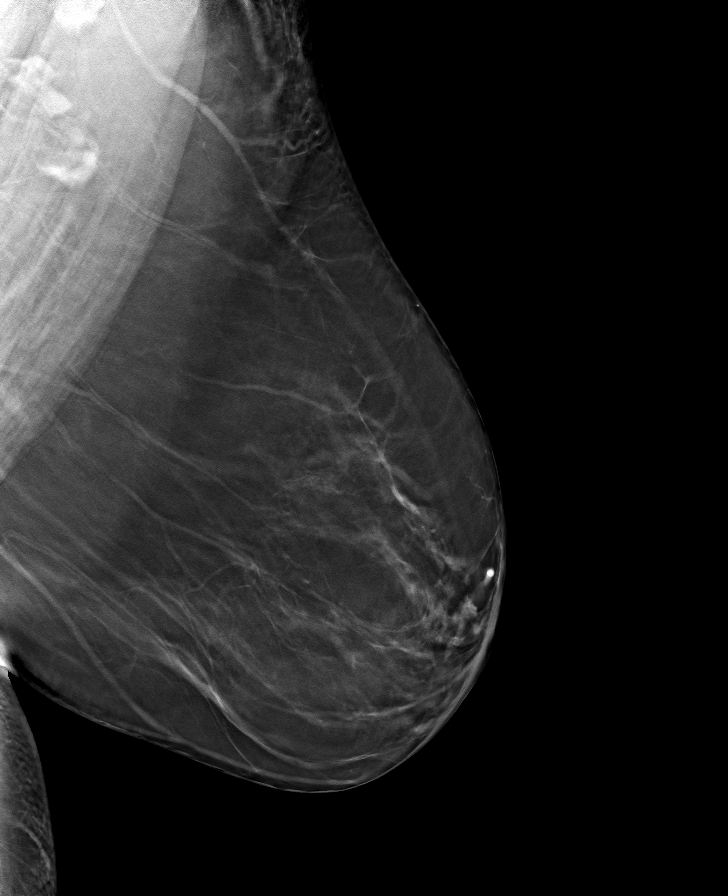

[8 of 24 positions shown; findings below may reference images not displayed]

ACR Breast Density Category b: There are scattered areas of
fibroglandular density.
FINDINGS: There are no findings suspicious for malignancy. Images were
processed with CAD.
IMPRESSION: No mammographic evidence of malignancy. A result letter of this
screening mammogram will be mailed directly to the patient.

RECOMMENDATION:
Screening mammogram in one year. (Code:[id number])

BI-RADS CATEGORY  1: Negative.

## 2021-05-11 LAB — HM DIABETES EYE EXAM

## 2021-05-12 ENCOUNTER — Encounter: Payer: Self-pay | Admitting: Internal Medicine

## 2021-06-05 ENCOUNTER — Other Ambulatory Visit: Payer: Self-pay | Admitting: Internal Medicine

## 2021-06-09 ENCOUNTER — Other Ambulatory Visit: Payer: Self-pay

## 2021-06-09 ENCOUNTER — Ambulatory Visit: Payer: BC Managed Care – PPO | Admitting: Internal Medicine

## 2021-06-09 ENCOUNTER — Encounter: Payer: Self-pay | Admitting: Internal Medicine

## 2021-06-09 VITALS — BP 124/86 | HR 100 | Temp 98.5°F | Ht 67.0 in | Wt 206.2 lb

## 2021-06-09 DIAGNOSIS — E1165 Type 2 diabetes mellitus with hyperglycemia: Secondary | ICD-10-CM

## 2021-06-09 DIAGNOSIS — Z23 Encounter for immunization: Secondary | ICD-10-CM | POA: Diagnosis not present

## 2021-06-09 DIAGNOSIS — Z6832 Body mass index (BMI) 32.0-32.9, adult: Secondary | ICD-10-CM

## 2021-06-09 DIAGNOSIS — E6609 Other obesity due to excess calories: Secondary | ICD-10-CM | POA: Diagnosis not present

## 2021-06-09 DIAGNOSIS — L299 Pruritus, unspecified: Secondary | ICD-10-CM | POA: Diagnosis not present

## 2021-06-09 DIAGNOSIS — I10 Essential (primary) hypertension: Secondary | ICD-10-CM

## 2021-06-09 LAB — CMP14+EGFR
ALT: 32 IU/L (ref 0–32)
AST: 27 IU/L (ref 0–40)
Albumin/Globulin Ratio: 2.1 (ref 1.2–2.2)
Albumin: 4.5 g/dL (ref 3.8–4.8)
Alkaline Phosphatase: 122 IU/L — ABNORMAL HIGH (ref 44–121)
BUN/Creatinine Ratio: 13 (ref 12–28)
BUN: 12 mg/dL (ref 8–27)
Bilirubin Total: 0.3 mg/dL (ref 0.0–1.2)
CO2: 23 mmol/L (ref 20–29)
Calcium: 9 mg/dL (ref 8.7–10.3)
Chloride: 103 mmol/L (ref 96–106)
Creatinine, Ser: 0.9 mg/dL (ref 0.57–1.00)
Globulin, Total: 2.1 g/dL (ref 1.5–4.5)
Glucose: 123 mg/dL — ABNORMAL HIGH (ref 70–99)
Potassium: 3.9 mmol/L (ref 3.5–5.2)
Sodium: 140 mmol/L (ref 134–144)
Total Protein: 6.6 g/dL (ref 6.0–8.5)
eGFR: 73 mL/min/{1.73_m2} (ref >59)

## 2021-06-09 LAB — HEMOGLOBIN A1C
Est. average glucose Bld gHb Est-mCnc: 126 mg/dL
Hgb A1c MFr Bld: 6 % — ABNORMAL HIGH (ref 4.8–5.6)

## 2021-06-09 MED ORDER — HYDROXYZINE HCL 10 MG PO TABS: ORAL_TABLET | ORAL | 0 refills | Status: DC

## 2021-06-09 MED ORDER — ZOSTER VAC RECOMB ADJUVANTED 50 MCG/0.5ML IM SUSR: 0.5000 mL | Freq: Once | INTRAMUSCULAR | 0 refills | Status: DC

## 2021-06-09 NOTE — Patient Instructions (Signed)

## 2021-06-09 NOTE — Progress Notes (Signed)
This visit occurred during the SARS-CoV-2 public health emergency.  Safety protocols were in place, including screening questions prior to the visit, additional usage of staff PPE, and extensive cleaning of exam room while observing appropriate contact time as indicated for disinfecting solutions.  Subjective:     Patient ID: Nichole George , female    DOB: January 01, 1961 , 60 y.o.   MRN: [id number]   Chief Complaint  Patient presents with   Diabetes   Hypertension    HPI  She reports for DM/HTN f/u. She reports compliance with meds. She denies having any issues with the medication. Denies headaches, chest pain and shortness of breath.   Diabetes She presents for her initial diabetic visit. She has type 2 diabetes mellitus. The initial diagnosis of diabetes was made 4 weeks ago. There are no hypoglycemic associated symptoms. Pertinent negatives for diabetes include no blurred vision and no chest pain. There are no hypoglycemic complications. Risk factors for coronary artery disease include diabetes mellitus, hypertension, post-menopausal and obesity. She is following a diabetic diet. She participates in exercise three times a week. Eye exam is current.  Hypertension This is a chronic problem. The problem has been gradually improving since onset. The problem is uncontrolled. Pertinent negatives include no blurred vision, chest pain, palpitations or shortness of breath. The current treatment provides moderate improvement. Compliance problems include exercise.     Past Medical History:  Diagnosis Date   Arthritis    left ankle and right big toe and both knees   Cervical dysplasia, moderate    Hyperlipidemia    Hypertension    Type 2 diabetes mellitus (HCC)    diet  and exercise controlled     Family History  Problem Relation Age of Onset   Cancer Other    Hypertension Other    Hyperlipidemia Other    Healthy Mother    Pneumonia Father    Breast cancer Sister 47     Current  Outpatient Medications:    Ascorbic Acid (VITAMIN C) POWD, Take by mouth. 1/2 tsp daily , Disp: , Rfl:    aspirin 81 MG chewable tablet, Monday and Wednesday and Friday, Disp: , Rfl:    atorvastatin (LIPITOR) 40 MG tablet, TAKE 1 TABLET DAILY MONDAY THROUGH SATURDAY, SKIP SUNDAYS, Disp: 90 tablet, Rfl: 1   BLACK ELDERBERRY,BERRY-FLOWER, PO, Take by mouth. 2 tsp daily, Disp: , Rfl:    Cholecalciferol (VITAMIN D3) 50 MCG (2000 UT) capsule, Take 5,000 Units by mouth daily. , Disp: , Rfl:    Coenzyme Q10 10 MG capsule, , Disp: , Rfl:    fluticasone (FLONASE) 50 MCG/ACT nasal spray, SPRAY ONE SPRAY INTO EACH NOSTRIL DAILY, Disp: 15.8 mL, Rfl: 5   hydrOXYzine (ATARAX) 10 MG tablet, One to two tabs po qpm prn itching, may cause drowsiness, Disp: 30 tablet, Rfl: 0   loratadine (CLARITIN) 10 MG tablet, Take 1 tablet (10 mg total) by mouth daily., Disp: 90 tablet, Rfl: 1   MAGNESIUM PO, , Disp: , Rfl:    OZEMPIC, 0.25 OR 0.5 MG/DOSE, 2 MG/1.5ML SOPN, INJECT 0.5 MG INTO THE SKIN ONCE A WEEK, Disp: 3 mL, Rfl: 3   valsartan (DIOVAN) 160 MG tablet, TAKE 1 TABLET BY MOUTH EVERY DAY, Disp: 90 tablet, Rfl: 2   No Known Allergies   Review of Systems  Eyes:  Negative for blurred vision.  Respiratory:  Negative for shortness of breath.   Cardiovascular:  Negative for chest pain and palpitations.    Today's Vitals  06/09/21 0856  BP: 124/86  Pulse: 100  Temp: 98.5 F (36.9 C)  Weight: 206 lb 3.2 oz (93.5 kg)  Height: $Remove'5\' 7"'eiliaMn$  (1.702 m)   Body mass index is 32.3 kg/m.  Wt Readings from Last 3 Encounters:  06/09/21 206 lb 3.2 oz (93.5 kg)  01/28/21 201 lb 9.6 oz (91.4 kg)  01/27/21 202 lb (91.6 kg)    BP Readings from Last 3 Encounters:  06/09/21 124/86  01/28/21 126/88  01/27/21 124/78    Objective:  Physical Exam Vitals and nursing note reviewed.  Constitutional:      Appearance: Normal appearance.  HENT:     Head: Normocephalic and atraumatic.     Nose:     Comments: Masked      Mouth/Throat:     Comments: Masked  Cardiovascular:     Rate and Rhythm: Normal rate and regular rhythm.     Heart sounds: Normal heart sounds.  Pulmonary:     Effort: Pulmonary effort is normal.     Breath sounds: Normal breath sounds.  Musculoskeletal:     Cervical back: Normal range of motion.  Skin:    General: Skin is warm.  Neurological:     General: No focal deficit present.     Mental Status: She is alert.  Psychiatric:        Mood and Affect: Mood normal.        Behavior: Behavior normal.        Assessment And Plan:     1. Uncontrolled type 2 diabetes mellitus with hyperglycemia (HCC) Comments: Chronic, I will check labs as listed below. Encouraged to limit her intake of sweet treats. I will adjust meds as needed. She will f/u 3-4 months. - Hemoglobin A1c  2. Chronic hypertension Comments: Chronic, well controlled. Encouraged to follow low sodium diet. Advised to follow low sodium diet.  - CMP14+EGFR  3. Pruritus Comments: I will send rx hydroxyzine 12.$RemoveBeforeDEI'5mg'FlZioOMsTPCmjCgO$  po qpm prn. Encouraged to stay well hydrated and to keep area in question well moisturized.   4. Class 1 obesity due to excess calories with serious comorbidity and body mass index (BMI) of 32.0 to 32.9 in adult Comments: She is aware of 5 lb weight gain. Encouraged to strive for BMI <30 to decrease cardiac risk. Advised to aim for at least 150 minutes of exercise per week.    5. Immunization due Comments: She was given Shingrix IM x 1.   Patient was given opportunity to ask questions. Patient verbalized understanding of the plan and was able to repeat key elements of the plan. All questions were answered to their satisfaction.   I, Maximino Greenland, MD, have reviewed all documentation for this visit. The documentation on 06/09/21 for the exam, diagnosis, procedures, and orders are all accurate and complete.   IF YOU HAVE BEEN REFERRED TO A SPECIALIST, IT MAY TAKE 1-2 WEEKS TO SCHEDULE/PROCESS THE REFERRAL. IF  YOU HAVE NOT HEARD FROM US/SPECIALIST IN TWO WEEKS, PLEASE GIVE [place] A CALL AT [phone].   THE PATIENT IS ENCOURAGED TO PRACTICE SOCIAL DISTANCING DUE TO THE COVID-19 PANDEMIC.

## 2021-06-15 ENCOUNTER — Encounter: Payer: Self-pay | Admitting: Internal Medicine

## 2021-06-22 ENCOUNTER — Other Ambulatory Visit: Payer: Self-pay | Admitting: Internal Medicine

## 2021-07-26 ENCOUNTER — Encounter: Payer: Self-pay | Admitting: Internal Medicine

## 2021-08-04 ENCOUNTER — Encounter: Payer: Self-pay | Admitting: Internal Medicine

## 2021-08-18 ENCOUNTER — Encounter: Payer: Self-pay | Admitting: Internal Medicine

## 2021-08-18 ENCOUNTER — Other Ambulatory Visit: Payer: Self-pay | Admitting: Internal Medicine

## 2021-08-23 ENCOUNTER — Other Ambulatory Visit: Payer: Self-pay

## 2021-08-23 MED ORDER — OZEMPIC (0.25 OR 0.5 MG/DOSE) 2 MG/1.5ML ~~LOC~~ SOPN: 0.5000 mg | PEN_INJECTOR | SUBCUTANEOUS | 3 refills | Status: DC

## 2021-09-30 ENCOUNTER — Other Ambulatory Visit: Payer: Self-pay | Admitting: Internal Medicine

## 2021-10-12 ENCOUNTER — Ambulatory Visit: Payer: BC Managed Care – PPO | Admitting: Internal Medicine

## 2021-10-12 ENCOUNTER — Encounter: Payer: Self-pay | Admitting: Internal Medicine

## 2021-10-12 VITALS — BP 118/80 | HR 80 | Temp 98.1°F | Ht 66.0 in | Wt 213.2 lb

## 2021-10-12 DIAGNOSIS — I10 Essential (primary) hypertension: Secondary | ICD-10-CM

## 2021-10-12 DIAGNOSIS — E6609 Other obesity due to excess calories: Secondary | ICD-10-CM

## 2021-10-12 DIAGNOSIS — E785 Hyperlipidemia, unspecified: Secondary | ICD-10-CM

## 2021-10-12 DIAGNOSIS — I779 Disorder of arteries and arterioles, unspecified: Secondary | ICD-10-CM | POA: Insufficient documentation

## 2021-10-12 DIAGNOSIS — M778 Other enthesopathies, not elsewhere classified: Secondary | ICD-10-CM

## 2021-10-12 DIAGNOSIS — E1169 Type 2 diabetes mellitus with other specified complication: Secondary | ICD-10-CM

## 2021-10-12 DIAGNOSIS — Z6834 Body mass index (BMI) 34.0-34.9, adult: Secondary | ICD-10-CM

## 2021-10-12 MED ORDER — KETOROLAC TROMETHAMINE 60 MG/2ML IM SOLN
60.0000 mg | Freq: Once | INTRAMUSCULAR | Status: AC
  Administered 2021-10-12: 60 mg via INTRAMUSCULAR

## 2021-10-12 NOTE — Progress Notes (Signed)
?Rich Brave [place],acting as a Education administrator for Maximino Greenland, MD.,have documented all relevant documentation on the behalf of Maximino Greenland, MD,as directed by  Maximino Greenland, MD while in the presence of Maximino Greenland, MD.  ?This visit occurred during the SARS-CoV-2 public health emergency.  Safety protocols were in place, including screening questions prior to the visit, additional usage of staff PPE, and extensive cleaning of exam room while observing appropriate contact time as indicated for disinfecting solutions. ? ?Subjective:  ?  ? Patient ID: Nichole George , female    DOB: [birth year] , 61 y.o.   MRN: [id number] ? ? ?Chief Complaint  ?Patient presents with  ? Diabetes  ? Hypertension  ? ? ?HPI ? ?She reports for DM/HTN f/u. She reports compliance with meds. She denies having any issues with the medication. She denies headaches, chest pain and shortness of breath.  ? ?Diabetes ?She presents for her initial diabetic visit. She has type 2 diabetes mellitus. The initial diagnosis of diabetes was made 4 weeks ago. There are no hypoglycemic associated symptoms. Pertinent negatives for diabetes include no blurred vision and no chest pain. There are no hypoglycemic complications. Risk factors for coronary artery disease include diabetes mellitus, hypertension, post-menopausal and obesity. She is following a diabetic diet. She participates in exercise three times a week. Eye exam is current.  ?Hypertension ?This is a chronic problem. The problem has been gradually improving since onset. The problem is uncontrolled. Pertinent negatives include no blurred vision, chest pain, palpitations or shortness of breath. The current treatment provides moderate improvement. Compliance problems include exercise.    ? ?Past Medical History:  ?Diagnosis Date  ? Arthritis   ? left ankle and right big toe and both knees  ? Cervical dysplasia, moderate   ? Hyperlipidemia   ? Hypertension   ? Type 2 diabetes mellitus ([place])   ? diet   and exercise controlled  ?  ? ?Family History  ?Problem Relation Age of Onset  ? Cancer Other   ? Hypertension Other   ? Hyperlipidemia Other   ? Healthy Mother   ? Pneumonia Father   ? Breast cancer Sister 32  ? ? ? ?Current Outpatient Medications:  ?  Ascorbic Acid (VITAMIN C) POWD, Take by mouth. 1/2 tsp daily , Disp: , Rfl:  ?  aspirin 81 MG chewable tablet, Monday and Wednesday and Friday, Disp: , Rfl:  ?  atorvastatin (LIPITOR) 40 MG tablet, TAKE 1 TABLET DAILY MONDAY THROUGH SATURDAY, SKIP SUNDAYS, Disp: 30 tablet, Rfl: 1 ?  BLACK ELDERBERRY,BERRY-FLOWER, PO, Take by mouth. 2 tsp daily, Disp: , Rfl:  ?  Cholecalciferol (VITAMIN D3) 50 MCG (2000 UT) capsule, Take 5,000 Units by mouth daily. , Disp: , Rfl:  ?  Coenzyme Q10 10 MG capsule, , Disp: , Rfl:  ?  fluticasone (FLONASE) 50 MCG/ACT nasal spray, SPRAY ONE SPRAY INTO EACH NOSTRIL DAILY, Disp: 15.8 mL, Rfl: 5 ?  loratadine (CLARITIN) 10 MG tablet, Take 1 tablet (10 mg total) by mouth daily., Disp: 90 tablet, Rfl: 1 ?  MAGNESIUM PO, , Disp: , Rfl:  ?  OZEMPIC, 0.25 OR 0.5 MG/DOSE, 2 MG/1.5ML SOPN, INJECT 0.5 MG INTO THE SKIN ONCE A WEEK, Disp: 3 mL, Rfl: 3 ?  valsartan (DIOVAN) 160 MG tablet, TAKE 1 TABLET BY MOUTH EVERY DAY, Disp: 90 tablet, Rfl: 2  ? ?No Known Allergies  ? ?Review of Systems  ?Constitutional: Negative.   ?Eyes:  Negative for blurred vision.  ?Respiratory:  Negative.  Negative for shortness of breath.   ?Cardiovascular: Negative.  Negative for chest pain and palpitations.  ?Gastrointestinal: Negative.   ?Musculoskeletal:  Positive for arthralgias.  ?     She was diagnosed with tendinitis by Ortho in right wrist and elbow. She got cortisone shot about a month ago, and her sx returned yesterday.   ?Neurological: Negative.   ?Psychiatric/Behavioral: Negative.     ? ?Today's Vitals  ? 10/12/21 0830  ?BP: 118/80  ?Pulse: 80  ?Temp: 98.1 ?F (36.7 ?C)  ?SpO2: 98%  ?Weight: 213 lb 3.2 oz (96.7 kg)  ?Height: _0  (1.676 m)  ?PainSc: 0-No pain   ? ?Body mass index is 34.41 kg/m?.  ?Wt Readings from Last 3 Encounters:  ?10/12/21 213 lb 3.2 oz (96.7 kg)  ?06/09/21 206 lb 3.2 oz (93.5 kg)  ?01/28/21 201 lb 9.6 oz (91.4 kg)  ?  ? ?Objective:  ?Physical Exam ?Vitals and nursing note reviewed.  ?Constitutional:   ?   Appearance: Normal appearance. She is obese.  ?HENT:  ?   Head: Normocephalic and atraumatic.  ?   Nose:  ?   Comments: Masked  ?   Mouth/Throat:  ?   Comments: Masked  ?Eyes:  ?   Extraocular Movements: Extraocular movements intact.  ?Cardiovascular:  ?   Rate and Rhythm: Normal rate and regular rhythm.  ?   Heart sounds: Normal heart sounds.  ?Pulmonary:  ?   Effort: Pulmonary effort is normal.  ?   Breath sounds: Normal breath sounds.  ?Musculoskeletal:  ?   Cervical back: Normal range of motion.  ?Skin: ?   General: Skin is warm.  ?Neurological:  ?   General: No focal deficit present.  ?   Mental Status: She is alert.  ?Psychiatric:     ?   Mood and Affect: Mood normal.     ?   Behavior: Behavior normal.  ?   ?Assessment And Plan:  ?   ?1. Dyslipidemia associated with type 2 diabetes mellitus ([place]) ?Comments: Chronic, I will check labs as below. She is encouraged to follow heart healthy lifestyle and to limit refined carbs,  ?- CMP14+EGFR ?- Hemoglobin A1c ?- Lipid panel ? ?2. Chronic hypertension ?Comments: Chronic, well controlled. She will c/w valsartan/hctz and advised to follow low sodium diet.  ? ?3. Carotid artery disease, unspecified laterality, unspecified type ([place]) ?Comments: She is currently on atorvastatin, LDL goal <55.  ? ?4. Right elbow tendinitis ?Comments: She is encouraged to follow an anti-inflammatory diet. Advised to drink ginger/turmeric tea daily.  ?- ketorolac (TORADOL) injection 60 mg ? ?5. Class 1 obesity due to excess calories with serious comorbidity and body mass index (BMI) of 34.0 to 34.9 in adult ?Comments: She is encouraged to aim for at least 150 minutes of exercise per week, aimin for BMI<30 to decrease  cardiac risk.  ?  ?Patient was given opportunity to ask questions. Patient verbalized understanding of the plan and was able to repeat key elements of the plan. All questions were answered to their satisfaction.  ? ?I, Maximino Greenland, MD, have reviewed all documentation for this visit. The documentation on 10/12/21 for the exam, diagnosis, procedures, and orders are all accurate and complete.  ? ?IF YOU HAVE BEEN REFERRED TO A SPECIALIST, IT MAY TAKE 1-2 WEEKS TO SCHEDULE/PROCESS THE REFERRAL. IF YOU HAVE NOT HEARD FROM US/SPECIALIST IN TWO WEEKS, PLEASE GIVE [place] A CALL AT [phone] X [phone].  ? ?THE PATIENT IS ENCOURAGED TO PRACTICE  SOCIAL DISTANCING DUE TO THE COVID-19 PANDEMIC.   ?

## 2021-10-12 NOTE — Patient Instructions (Signed)

## 2021-10-13 ENCOUNTER — Encounter: Payer: Self-pay | Admitting: Internal Medicine

## 2021-10-13 LAB — CMP14+EGFR
ALT: 37 IU/L — ABNORMAL HIGH (ref 0–32)
AST: 32 IU/L (ref 0–40)
Albumin/Globulin Ratio: 1.7 (ref 1.2–2.2)
Albumin: 4.3 g/dL (ref 3.8–4.8)
Alkaline Phosphatase: 111 IU/L (ref 44–121)
BUN/Creatinine Ratio: 15 (ref 12–28)
BUN: 13 mg/dL (ref 8–27)
Bilirubin Total: 0.4 mg/dL (ref 0.0–1.2)
CO2: 21 mmol/L (ref 20–29)
Calcium: 9.2 mg/dL (ref 8.7–10.3)
Chloride: 107 mmol/L — ABNORMAL HIGH (ref 96–106)
Creatinine, Ser: 0.88 mg/dL (ref 0.57–1.00)
Globulin, Total: 2.5 g/dL (ref 1.5–4.5)
Glucose: 103 mg/dL — ABNORMAL HIGH (ref 70–99)
Potassium: 4.3 mmol/L (ref 3.5–5.2)
Sodium: 143 mmol/L (ref 134–144)
Total Protein: 6.8 g/dL (ref 6.0–8.5)
eGFR: 75 mL/min/{1.73_m2} (ref >59)

## 2021-10-13 LAB — LIPID PANEL
Chol/HDL Ratio: 2.5 ratio (ref 0.0–4.4)
Cholesterol, Total: 160 mg/dL (ref 100–199)
HDL: 63 mg/dL (ref >39)
LDL Chol Calc (NIH): 79 mg/dL (ref 0–99)
Triglycerides: 96 mg/dL (ref 0–149)
VLDL Cholesterol Cal: 18 mg/dL (ref 5–40)

## 2021-10-13 LAB — HEMOGLOBIN A1C
Est. average glucose Bld gHb Est-mCnc: 126 mg/dL
Hgb A1c MFr Bld: 6 % — ABNORMAL HIGH (ref 4.8–5.6)

## 2021-10-19 ENCOUNTER — Other Ambulatory Visit: Payer: Self-pay | Admitting: Internal Medicine

## 2021-10-26 ENCOUNTER — Encounter: Payer: Self-pay | Admitting: Internal Medicine

## 2021-10-26 ENCOUNTER — Other Ambulatory Visit: Payer: Self-pay

## 2021-10-26 ENCOUNTER — Ambulatory Visit (INDEPENDENT_AMBULATORY_CARE_PROVIDER_SITE_OTHER): Payer: BC Managed Care – PPO

## 2021-10-26 DIAGNOSIS — Z23 Encounter for immunization: Secondary | ICD-10-CM

## 2021-10-26 MED ORDER — OZEMPIC (1 MG/DOSE) 4 MG/3ML ~~LOC~~ SOPN: 1.0000 mg | PEN_INJECTOR | SUBCUTANEOUS | 1 refills | Status: DC

## 2021-10-26 NOTE — Progress Notes (Signed)
? ?  Covid-[facility] Vaccination [facility] ? ?Name:  Nichole George    ?MRN: [id number] ?DOB: 04/10/1961 ? ?10/26/2021 ? ?Nichole George was observed post Covid-19 immunization for 15 minutes without incident. She was provided with Vaccine Information Sheet and instruction to access the V-Safe system.  ? ?Nichole George was instructed to call 911 with any severe reactions post vaccine: ?Difficulty breathing  ?Swelling of face and throat  ?A fast heartbeat  ?A bad rash all over body  ?Dizziness and weakness  ? ?Immunizations Administered   ? ? Name Date Dose VIS Date Route  ? Moderna Covid-19 vaccine Bivalent Booster 10/26/2021  8:48 AM 0.5 mL 02/13/2021 Intramuscular  ? Manufacturer: Moderna  ? Lot: [id number]  ? [place]: 80777-282-99  ? ?  ? ? ?

## 2021-10-28 ENCOUNTER — Other Ambulatory Visit: Payer: Self-pay | Admitting: Internal Medicine

## 2021-10-28 ENCOUNTER — Encounter: Payer: Self-pay | Admitting: Internal Medicine

## 2021-10-28 NOTE — Progress Notes (Signed)
Pt contacted regarding Ozempic pen, she wants to gradually increase to '1mg'$  dose. She has the '1mg'$  pen, 54 clicks is equivalent to .'75mg'$ . She will start this today, 4/27. She will do for two weeks, if tolerated, she will increase to '1mg'$  weekly.  ?RS ?

## 2021-10-29 ENCOUNTER — Encounter: Payer: Self-pay | Admitting: Internal Medicine

## 2021-11-15 ENCOUNTER — Other Ambulatory Visit: Payer: Self-pay | Admitting: Internal Medicine

## 2021-12-01 ENCOUNTER — Encounter: Payer: Self-pay | Admitting: Internal Medicine

## 2021-12-04 ENCOUNTER — Other Ambulatory Visit: Payer: Self-pay | Admitting: Internal Medicine

## 2021-12-06 ENCOUNTER — Other Ambulatory Visit: Payer: Self-pay

## 2021-12-06 MED ORDER — OZEMPIC (1 MG/DOSE) 4 MG/3ML ~~LOC~~ SOPN
1.0000 mg | PEN_INJECTOR | SUBCUTANEOUS | 1 refills | Status: DC
Start: 2021-12-06 — End: 2021-12-30

## 2021-12-10 ENCOUNTER — Encounter: Payer: Self-pay | Admitting: Internal Medicine

## 2021-12-14 ENCOUNTER — Encounter: Payer: Self-pay | Admitting: Internal Medicine

## 2021-12-15 ENCOUNTER — Encounter: Payer: Self-pay | Admitting: Internal Medicine

## 2021-12-22 ENCOUNTER — Ambulatory Visit: Payer: BC Managed Care – PPO | Admitting: Nurse Practitioner

## 2021-12-22 ENCOUNTER — Encounter: Payer: Self-pay | Admitting: Internal Medicine

## 2021-12-22 ENCOUNTER — Encounter: Payer: Self-pay | Admitting: Nurse Practitioner

## 2021-12-22 VITALS — BP 124/64 | HR 88 | Temp 98.2°F | Ht 66.0 in | Wt 207.4 lb

## 2021-12-22 DIAGNOSIS — L03115 Cellulitis of right lower limb: Secondary | ICD-10-CM

## 2021-12-22 DIAGNOSIS — L03039 Cellulitis of unspecified toe: Secondary | ICD-10-CM

## 2021-12-22 MED ORDER — CEPHALEXIN 500 MG PO CAPS: 500.0000 mg | ORAL_CAPSULE | Freq: Four times a day (QID) | ORAL | 0 refills | Status: AC

## 2021-12-22 MED ORDER — CEFTRIAXONE SODIUM 1 G IJ SOLR
1.0000 g | Freq: Once | INTRAMUSCULAR | Status: AC
  Administered 2021-12-22: 1 g via INTRAMUSCULAR

## 2021-12-22 NOTE — Patient Instructions (Addendum)
Nail Bed Injury The nail bed is the soft tissue under a fingernail or toenail. The nail bed can be injured in different ways. You may: Get bruising or bleeding under the nail. Get cuts in the nail or nail bed. Lose all or part of the nail. After your nail is hurt or torn off, it can take many months to grow again. The nail may not grow back normally. What are the causes? This condition is usually caused by crushing, pinching, cutting, or tearing injuries of the fingertip or toe. These injuries might happen when a finger or toe gets: Caught in a door. Hit by a hammer. Damaged in accidents while you are using power tools or machinery. What are the signs or symptoms? Symptoms vary depending on the type of injury. Symptoms may include: Pain in the injured area. Bleeding. Swelling. A change in color of the area. Collection of blood under the nail (hematoma). Damage to the nail, such as: A deformed or split nail. A loose nail that is not stuck to the nail bed. Loss of all or part of the nail. How is this treated? Treatment may depend on the type of injury. Some injuries may not need any treatment other than keeping the area clean and free of infection. Treatment may include: Draining blood from under the nail. This can be done by making a small hole in the nail. Removing all or part of your nail. This might be done in order to stitch (suture) any cut in the nail bed. Stitching a torn-off nail back in place. Putting bandages (dressings) or splints on the area. Taking medicines, such as: Antibiotic medicine to help prevent infection. Pain medicine. Having a tetanus shot. This may be needed if you have not had a tetanus shot in the last 10 years. Follow these instructions at home: Managing pain, stiffness, and swelling Raise (elevate) the injured area above the level of your heart while you are sitting or lying down. Keep your injury protected with bandages or splints as told by your  doctor. For an injured toenail: Try not to walk on your injured leg. Wear an open-toed shoe when you walk. Try not to let your leg hang down (dangle) when you are sitting or lying down. Wound care Follow any wound care instructions given by your doctor. Make sure you: Keep the injured area clean. Keep any bandages clean and dry. Wash your hands with soap and water for at least 20 seconds before and after you change any bandage. If you cannot use soap and water, use hand sanitizer. Change or take off the bandage only as told by your doctor. Leave any stitches in place. These may need to stay in place for 2 weeks. Check the injured area every day for signs of infection. Check for: More redness, swelling, or pain. More fluid or blood. Warmth. Pus or a bad smell. General instructions Take over-the-counter and prescription medicines only as told by your doctor. If you were prescribed an antibiotic medicine, use it as told by your doctor. Do not stop using the antibiotic even if you start to feel better. Ask your doctor if the medicine prescribed to you requires you to avoid driving or using machinery. Keep all follow-up visits as told by your doctor. This is important. Contact a doctor if: Medicine does not help your pain. You have any of these problems in the injured area: More redness. More pain or swelling. More fluid or blood coming from the area. The area feels  warm to the touch. Pus or a bad smell coming from the area. You have a fever and your symptoms get worse. Get help right away if: You lose feeling (have numbness) in your finger or toe. Your finger or toe turns blue. Summary The nail bed is the soft tissue under a fingernail or toenail. Sometimes, after a nail or nail bed is injured, the nail may not grow back normally. Raise (elevate) the injured area above the level of your heart while you are sitting or lying down. Keep any bandages clean and dry. Change or take them  off only as told by your doctor. Take over-the-counter and prescription medicines only as told by your doctor. This information is not intended to replace advice given to you by your health care provider. Make sure you discuss any questions you have with your health care provider. Document Revised: 04/05/2019 Document Reviewed: 04/05/2019 Elsevier Patient Education  [place].

## 2021-12-22 NOTE — Progress Notes (Signed)
This visit occurred during the SARS-CoV-2 public health emergency.  Safety protocols were in place, including screening questions prior to the visit, additional usage of staff PPE, and extensive cleaning of exam room while observing appropriate contact time as indicated for disinfecting solutions.  Subjective:     Patient ID: Nichole George , female    DOB: [birth year] , 61 y.o.   MRN: [id number]   Chief Complaint  Patient presents with   Toe Pain    HPI  Patient presents today with toe pain in rt foot great toe, 3 days after nail salon. This was the first time she went to the nail salon.  Headache on and off for 3 days.   A few months ago she had began treating her feet for possible fungus. She tried several medications over the counter. Pain to right great toe after going to the nail salon a few days prior. She removed the nail polish due to the pain. She has used neosporin and bandaid. She has a podiatrist in [place].   Toe Pain  The incident occurred 3 to 5 days ago. There was no injury mechanism. Pain location: right great toe. Pertinent negatives include no inability to bear weight. She has tried nothing for the symptoms.     Past Medical History:  Diagnosis Date   Arthritis    left ankle and right big toe and both knees   Cervical dysplasia, moderate    Hyperlipidemia    Hypertension    Type 2 diabetes mellitus ([facility])    diet  and exercise controlled     Family History  Problem Relation Age of Onset   Cancer Other    Hypertension Other    Hyperlipidemia Other    Healthy Mother    Pneumonia Father    Breast cancer Sister 28     Current Outpatient Medications:    Ascorbic Acid (VITAMIN C) POWD, Take by mouth. 1/2 tsp daily , Disp: , Rfl:    aspirin 81 MG chewable tablet, Monday and Wednesday and Friday, Disp: , Rfl:    atorvastatin (LIPITOR) 40 MG tablet, TAKE 1 TABLET DAILY MONDAY THROUGH SATURDAY, SKIP SUNDAYS, Disp: 90 tablet, Rfl: 1   cephALEXin (KEFLEX) 500 MG  capsule, Take 1 capsule (500 mg total) by mouth 4 (four) times daily for 10 days., Disp: 40 capsule, Rfl: 0   Cholecalciferol (VITAMIN D3) 50 MCG (2000 UT) capsule, Take 5,000 Units by mouth daily. , Disp: , Rfl:    Coenzyme Q10 10 MG capsule, , Disp: , Rfl:    fluticasone (FLONASE) 50 MCG/ACT nasal spray, SPRAY ONE SPRAY INTO EACH NOSTRIL DAILY, Disp: 48 mL, Rfl: 1   loratadine (CLARITIN) 10 MG tablet, Take 1 tablet (10 mg total) by mouth daily., Disp: 90 tablet, Rfl: 1   MAGNESIUM PO, , Disp: , Rfl:    valsartan (DIOVAN) 160 MG tablet, TAKE 1 TABLET BY MOUTH EVERY DAY, Disp: 90 tablet, Rfl: 2   BLACK ELDERBERRY,BERRY-FLOWER, PO, Take by mouth. 2 tsp daily, Disp: , Rfl:    Semaglutide, 1 MG/DOSE, (OZEMPIC, 1 MG/DOSE,) 4 MG/3ML SOPN, Inject 1 mg into the skin once a week., Disp: 9 mL, Rfl: 1   No Known Allergies   Review of Systems  Constitutional: Negative.   HENT: Negative.    Eyes: Negative.   Respiratory: Negative.    Cardiovascular: Negative.   Gastrointestinal: Negative.   Endocrine: Negative.   Genitourinary: Negative.   Musculoskeletal: Negative.   Skin:  Positive for wound (right  great toe redness and pain).  Allergic/Immunologic: Negative.   Neurological:  Positive for headaches.  Hematological: Negative.   Psychiatric/Behavioral: Negative.       Today's Vitals   12/22/21 1414  BP: 124/64  Pulse: 88  Temp: 98.2 F (36.8 C)  TempSrc: Oral  Weight: 207 lb 6.4 oz (94.1 kg)  Height: '5\' 6"'$  (1.676 m)  PainSc: 8   PainLoc: Toe   Body mass index is 33.48 kg/m.   Objective:  Physical Exam Vitals reviewed.  Constitutional:      General: She is not in acute distress.    Appearance: Normal appearance.  Pulmonary:     Effort: Pulmonary effort is normal. No respiratory distress.  Musculoskeletal:        General: Tenderness (right great toe) present.  Skin:    General: Skin is warm and dry.     Capillary Refill: Capillary refill takes less than 2 seconds.      Findings: Erythema (right great toe) present.  Neurological:     General: No focal deficit present.     Mental Status: She is alert and oriented to person, place, and time.     Cranial Nerves: No cranial nerve deficit.     Motor: No weakness.  Psychiatric:        Mood and Affect: Mood normal.        Behavior: Behavior normal.        Thought Content: Thought content normal.        Judgment: Judgment normal.         Assessment And Plan:     1. Paronychia of great toe Comments: right great toe with erythema present and tenderness, will treat antibiotics and she is to follow up with her Podiatrist. - cephALEXin (KEFLEX) 500 MG capsule; Take 1 capsule (500 mg total) by mouth 4 (four) times daily for 10 days.  Dispense: 40 capsule; Refill: 0 - cefTRIAXone (ROCEPHIN) injection 1 g  2. Cellulitis of right lower extremity Comments: Encouraged to soak feet in warm water with epsom salt 1-2 times a day. Will also treat with rocephin due to the significant discomfort - cefTRIAXone (ROCEPHIN) injection 1 g     Patient was given opportunity to ask questions. Patient verbalized understanding of the plan and was able to repeat key elements of the plan. All questions were answered to their satisfaction.  Minette Brine, FNP   I, Minette Brine, FNP, have reviewed all documentation for this visit. The documentation on 12/22/21 for the exam, diagnosis, procedures, and orders are all accurate and complete.   IF YOU HAVE BEEN REFERRED TO A SPECIALIST, IT MAY TAKE 1-2 WEEKS TO SCHEDULE/PROCESS THE REFERRAL. IF YOU HAVE NOT HEARD FROM US/SPECIALIST IN TWO WEEKS, PLEASE GIVE [place] A CALL AT [phone].   THE PATIENT IS ENCOURAGED TO PRACTICE SOCIAL DISTANCING DUE TO THE COVID-19 PANDEMIC.

## 2021-12-29 ENCOUNTER — Encounter: Payer: Self-pay | Admitting: Internal Medicine

## 2021-12-30 ENCOUNTER — Other Ambulatory Visit: Payer: Self-pay

## 2021-12-30 MED ORDER — OZEMPIC (1 MG/DOSE) 4 MG/3ML ~~LOC~~ SOPN: 1.0000 mg | PEN_INJECTOR | SUBCUTANEOUS | 1 refills | Status: DC

## 2022-01-05 ENCOUNTER — Ambulatory Visit: Payer: BC Managed Care – PPO | Admitting: Plastic Surgery

## 2022-01-05 ENCOUNTER — Other Ambulatory Visit: Payer: Self-pay | Admitting: Internal Medicine

## 2022-01-05 ENCOUNTER — Encounter: Payer: Self-pay | Admitting: Plastic Surgery

## 2022-01-05 ENCOUNTER — Encounter: Payer: Self-pay | Admitting: Internal Medicine

## 2022-01-05 DIAGNOSIS — D1801 Hemangioma of skin and subcutaneous tissue: Secondary | ICD-10-CM | POA: Diagnosis not present

## 2022-01-05 MED ORDER — PROCTOFOAM HC 1-1 % EX FOAM: 1.0000 | Freq: Two times a day (BID) | CUTANEOUS | 0 refills | Status: DC

## 2022-01-05 NOTE — Progress Notes (Signed)
Patient ID: Nichole George, female    DOB: [birth year], 61 y.o.   MRN: [id number]   Chief Complaint  Patient presents with   Consult    The patient is a 61 yrs old female here for evaluation of her lower lip.  She states that she has a blue colored area on her lower lip that is a centimeter in size and has been present for the past 2 years.  It is getting larger.  It changes size.  She can feel it pulsating.  It looks like an angioma.  No other issues around her mouth and no other areas of concern or lymphadenopathy.    Review of Systems  Constitutional: Negative.   Eyes: Negative.   Respiratory: Negative.    Cardiovascular: Negative.   Gastrointestinal: Negative.   Endocrine: Negative.   Genitourinary:  Negative for difficulty urinating.  Musculoskeletal: Negative.   Skin:  Positive for rash.  Hematological: Negative.   Psychiatric/Behavioral: Negative.      Past Medical History:  Diagnosis Date   Arthritis    left ankle and right big toe and both knees   Cervical dysplasia, moderate    Hyperlipidemia    Hypertension    Type 2 diabetes mellitus ([place])    diet  and exercise controlled    Past Surgical History:  Procedure Laterality Date   CERVICAL CONIZATION W/BX N/A 04/19/2018   Procedure: CONIZATION CERVIX WITH BIOPSY;  Surgeon: Eldred Manges, MD;  Location: [facility];  Service: Gynecology;  Laterality: N/A;   CESAREAN SECTION     CHOLECYSTECTOMY     TUBAL LIGATION        Current Outpatient Medications:    Ascorbic Acid (VITAMIN C) POWD, Take by mouth. 1/2 tsp daily , Disp: , Rfl:    aspirin 81 MG chewable tablet, Monday and Wednesday and Friday, Disp: , Rfl:    atorvastatin (LIPITOR) 40 MG tablet, TAKE 1 TABLET DAILY MONDAY THROUGH SATURDAY, SKIP SUNDAYS, Disp: 90 tablet, Rfl: 1   Cholecalciferol (VITAMIN D3) 50 MCG (2000 UT) capsule, Take 5,000 Units by mouth daily. , Disp: , Rfl:    Coenzyme Q10 10 MG capsule, , Disp: , Rfl:     fluticasone (FLONASE) 50 MCG/ACT nasal spray, SPRAY ONE SPRAY INTO EACH NOSTRIL DAILY, Disp: 48 mL, Rfl: 1   loratadine (CLARITIN) 10 MG tablet, Take 1 tablet (10 mg total) by mouth daily., Disp: 90 tablet, Rfl: 1   MAGNESIUM PO, , Disp: , Rfl:    Semaglutide, 1 MG/DOSE, (OZEMPIC, 1 MG/DOSE,) 4 MG/3ML SOPN, Inject 1 mg into the skin once a week., Disp: 9 mL, Rfl: 1   valsartan (DIOVAN) 160 MG tablet, TAKE 1 TABLET BY MOUTH EVERY DAY, Disp: 90 tablet, Rfl: 2   Objective:   Vitals:   01/05/22 1115  BP: (!) 144/83  Pulse: 71  SpO2: 97%    Physical Exam Vitals reviewed.  Constitutional:      Appearance: Normal appearance.  HENT:     Head: Normocephalic and atraumatic.     Mouth/Throat:   Cardiovascular:     Rate and Rhythm: Normal rate.     Pulses: Normal pulses.  Pulmonary:     Effort: Pulmonary effort is normal.  Abdominal:     General: There is no distension.     Palpations: Abdomen is soft.     Tenderness: There is no abdominal tenderness.  Musculoskeletal:        General: No swelling or deformity.  Skin:    General: Skin is warm.     Capillary Refill: Capillary refill takes less than 2 seconds.     Coloration: Skin is not jaundiced.     Findings: No bruising.  Neurological:     Mental Status: She is alert and oriented to person, place, and time.  Psychiatric:        Mood and Affect: Mood normal.        Behavior: Behavior normal.        Thought Content: Thought content normal.     Assessment & Plan:  Angioma of skin  Recommend laser treatment with BBL or IPL.  This will require several treatments to get it under control.  I would like to submit to insurance and see if this can be covered.  Pictures were obtained of the patient and placed in the chart with the patient's or guardian's permission.   George Mason, DO

## 2022-01-10 ENCOUNTER — Telehealth: Payer: Self-pay | Admitting: Plastic Surgery

## 2022-01-10 NOTE — Telephone Encounter (Signed)
Authorization initiated for In office laser treatment of angioma. Auth submitted to blue e portal. Auth # [id number].

## 2022-01-12 ENCOUNTER — Telehealth: Payer: Self-pay

## 2022-01-12 NOTE — Telephone Encounter (Signed)
I called pt and left her a vm letting her know that her form has been completed and faxed. YL,RMA

## 2022-01-24 ENCOUNTER — Telehealth: Payer: Self-pay | Admitting: Plastic Surgery

## 2022-01-24 NOTE — Telephone Encounter (Signed)
Left message to return call to schedule laser tx.

## 2022-01-25 NOTE — Telephone Encounter (Signed)
Patient called back; she's already scheduled for first BBL appt on 9/1. Patient will wait until she comes in for the first appt to schedule any add'l appts.

## 2022-01-31 ENCOUNTER — Other Ambulatory Visit: Payer: Self-pay | Admitting: Obstetrics and Gynecology

## 2022-01-31 ENCOUNTER — Other Ambulatory Visit: Payer: Self-pay | Admitting: Internal Medicine

## 2022-01-31 DIAGNOSIS — Z1231 Encounter for screening mammogram for malignant neoplasm of breast: Secondary | ICD-10-CM

## 2022-02-01 ENCOUNTER — Ambulatory Visit (INDEPENDENT_AMBULATORY_CARE_PROVIDER_SITE_OTHER): Payer: BC Managed Care – PPO | Admitting: Internal Medicine

## 2022-02-01 ENCOUNTER — Encounter: Payer: Self-pay | Admitting: Internal Medicine

## 2022-02-01 VITALS — BP 134/68 | HR 71 | Temp 98.3°F | Ht 65.0 in | Wt 206.2 lb

## 2022-02-01 DIAGNOSIS — Z0000 Encounter for general adult medical examination without abnormal findings: Secondary | ICD-10-CM

## 2022-02-01 DIAGNOSIS — I10 Essential (primary) hypertension: Secondary | ICD-10-CM

## 2022-02-01 DIAGNOSIS — E785 Hyperlipidemia, unspecified: Secondary | ICD-10-CM

## 2022-02-01 DIAGNOSIS — E1169 Type 2 diabetes mellitus with other specified complication: Secondary | ICD-10-CM | POA: Diagnosis not present

## 2022-02-01 DIAGNOSIS — M25531 Pain in right wrist: Secondary | ICD-10-CM | POA: Insufficient documentation

## 2022-02-01 LAB — POCT URINALYSIS DIPSTICK
Glucose, UA: NEGATIVE
Ketones, UA: NEGATIVE
Nitrite, UA: NEGATIVE
Protein, UA: POSITIVE — AB
Spec Grav, UA: >=1.03 — AB (ref 1.010–1.025)
Urobilinogen, UA: 0.2 E.U./dL
pH, UA: 6 (ref 5.0–8.0)

## 2022-02-01 NOTE — Patient Instructions (Signed)

## 2022-02-01 NOTE — Progress Notes (Signed)
Barnet Glasgow Martin,acting as a Education administrator for Maximino Greenland, MD.,have documented all relevant documentation on the behalf of Maximino Greenland, MD,as directed by  Maximino Greenland, MD while in the presence of Maximino Greenland, MD.   Subjective:     Patient ID: Nichole George , female    DOB: February 20, 1961 , 61 y.o.   MRN: [id number]   Chief Complaint  Patient presents with   Annual Exam   Diabetes   Hypertension    HPI  Patient presents today for HM.  She is followed by Dr. Mancel Bale for her GYN exams, next visit scheduled for Sept 2023. Patient is compliant with medications. Patient has no other concerns today.   BP Readings from Last 3 Encounters: 02/01/22 : 134/68 01/05/22 : (!) 144/83 12/22/21 : 124/64    Diabetes She presents for her initial diabetic visit. She has type 2 diabetes mellitus. The initial diagnosis of diabetes was made 4 weeks ago. There are no hypoglycemic associated symptoms. Pertinent negatives for diabetes include no blurred vision and no chest pain. There are no hypoglycemic complications. She participates in exercise daily. An ACE inhibitor/angiotensin II receptor blocker is being taken. Eye exam is current.  Hypertension This is a chronic problem. The current episode started more than 1 year ago. The problem has been gradually improving since onset. The problem is controlled. Pertinent negatives include no blurred vision, chest pain or palpitations. Risk factors for coronary artery disease include diabetes mellitus, dyslipidemia and post-menopausal state. Past treatments include angiotensin blockers. The current treatment provides moderate improvement.     Past Medical History:  Diagnosis Date   Arthritis    left ankle and right big toe and both knees   Cervical dysplasia, moderate    Hyperlipidemia    Hypertension    Type 2 diabetes mellitus (HCC)    diet  and exercise controlled     Family History  Problem Relation Age of Onset   Cancer Other     Hypertension Other    Hyperlipidemia Other    Healthy Mother    Pneumonia Father    Breast cancer Sister 46     Current Outpatient Medications:    Ascorbic Acid (VITAMIN C) POWD, Take by mouth. 1/2 tsp daily , Disp: , Rfl:    aspirin 81 MG chewable tablet, Monday and Wednesday and Friday, Disp: , Rfl:    atorvastatin (LIPITOR) 40 MG tablet, TAKE 1 TABLET DAILY MONDAY THROUGH SATURDAY, SKIP SUNDAYS, Disp: 90 tablet, Rfl: 1   Cholecalciferol (VITAMIN D3) 50 MCG (2000 UT) capsule, Take 5,000 Units by mouth daily. , Disp: , Rfl:    Coenzyme Q10 10 MG capsule, , Disp: , Rfl:    fluticasone (FLONASE) 50 MCG/ACT nasal spray, SPRAY ONE SPRAY INTO EACH NOSTRIL DAILY, Disp: 48 mL, Rfl: 1   loratadine (CLARITIN) 10 MG tablet, Take 1 tablet (10 mg total) by mouth daily., Disp: 90 tablet, Rfl: 1   MAGNESIUM PO, , Disp: , Rfl:    Semaglutide, 1 MG/DOSE, (OZEMPIC, 1 MG/DOSE,) 4 MG/3ML SOPN, Inject 1 mg into the skin once a week., Disp: 9 mL, Rfl: 1   valsartan (DIOVAN) 160 MG tablet, TAKE 1 TABLET BY MOUTH EVERY DAY, Disp: 90 tablet, Rfl: 2   hydrocortisone-pramoxine (PROCTOFOAM HC) rectal foam, Place 1 applicator rectally 2 (two) times daily. (Patient not taking: Reported on 02/01/2022), Disp: 10 g, Rfl: 0   No Known Allergies    The patient states she uses post menopausal status for birth  control. Last LMP was No LMP recorded. Patient is postmenopausal.. Negative for Dysmenorrhea. Negative for: breast discharge, breast lump(s), breast pain and breast self exam. Associated symptoms include abnormal vaginal bleeding. Pertinent negatives include abnormal bleeding (hematology), anxiety, decreased libido, depression, difficulty falling sleep, dyspareunia, history of infertility, nocturia, sexual dysfunction, sleep disturbances, urinary incontinence, urinary urgency, vaginal discharge and vaginal itching. Diet regular.The patient states her exercise level is  moderate.  . The patient's tobacco use is:  Social  History   Tobacco Use  Smoking Status Never  Smokeless Tobacco Never  . She has been exposed to passive smoke. The patient's alcohol use is:  Social History   Substance and Sexual Activity  Alcohol Use Yes   Comment: occ wine   Review of Systems  Constitutional: Negative.   HENT: Negative.    Eyes: Negative.  Negative for blurred vision.  Respiratory: Negative.    Cardiovascular: Negative.  Negative for chest pain and palpitations.  Gastrointestinal: Negative.   Endocrine: Negative.   Genitourinary: Negative.   Musculoskeletal:  Positive for arthralgias.       She c/o right wrist pain. She denies fall/trauma. She is not sure what triggered her sx. She has upcoming appt with Ortho.   Skin: Negative.   Allergic/Immunologic: Negative.   Neurological: Negative.   Hematological: Negative.   Psychiatric/Behavioral: Negative.       Today's Vitals   02/01/22 0833  BP: 134/68  Pulse: 71  Temp: 98.3 F (36.8 C)  TempSrc: Oral  Weight: 206 lb 3.2 oz (93.5 kg)  Height: 5\' 5"  (1.651 m)  PainSc: 5    Body mass index is 34.31 kg/m.  Wt Readings from Last 3 Encounters:  02/01/22 206 lb 3.2 oz (93.5 kg)  01/05/22 205 lb 12.8 oz (93.4 kg)  12/22/21 207 lb 6.4 oz (94.1 kg)     Objective:  Physical Exam Vitals and nursing note reviewed.  Constitutional:      Appearance: Normal appearance.  HENT:     Head: Normocephalic and atraumatic.     Right Ear: Tympanic membrane, ear canal and external ear normal.     Left Ear: Tympanic membrane, ear canal and external ear normal.     Nose: Nose normal.     Mouth/Throat:     Mouth: Mucous membranes are moist.     Pharynx: Oropharynx is clear.  Eyes:     Extraocular Movements: Extraocular movements intact.     Conjunctiva/sclera: Conjunctivae normal.     Pupils: Pupils are equal, round, and reactive to light.  Cardiovascular:     Rate and Rhythm: Normal rate and regular rhythm.     Pulses: Normal pulses.          Dorsalis pedis  pulses are 2+ on the right side and 2+ on the left side.     Heart sounds: Normal heart sounds.  Pulmonary:     Effort: Pulmonary effort is normal.     Breath sounds: Normal breath sounds.  Chest:  Breasts:    Tanner Score is 5.     Right: Normal.     Left: Normal.  Abdominal:     General: Abdomen is flat. Bowel sounds are normal.     Palpations: Abdomen is soft.  Genitourinary:    Comments: deferred Musculoskeletal:        General: Tenderness present. Normal range of motion.     Cervical back: Normal range of motion and neck supple.  Feet:     Right foot:  Protective Sensation: 5 sites tested.  5 sites sensed.     Skin integrity: Skin integrity normal.     Toenail Condition: Right toenails are normal.     Left foot:     Protective Sensation: 5 sites tested.  5 sites sensed.     Skin integrity: Skin integrity normal.     Toenail Condition: Left toenails are normal.  Skin:    General: Skin is warm and dry.  Neurological:     General: No focal deficit present.     Mental Status: She is alert and oriented to person, place, and time.  Psychiatric:        Mood and Affect: Mood normal.        Behavior: Behavior normal.      Assessment And Plan:     1. Routine general medical examination at health care facility Comments: A full exam was performed. Importance of monthly self breast exams was discussed with the patient. PATIENT IS ADVISED TO GET 30-45 MINUTES REGULAR EXERCISE NO LESS THAN FOUR TO FIVE DAYS PER WEEK - BOTH WEIGHTBEARING EXERCISES AND AEROBIC ARE RECOMMENDED.  PATIENT IS ADVISED TO FOLLOW A HEALTHY DIET WITH AT LEAST SIX FRUITS/VEGGIES PER DAY, DECREASE INTAKE OF RED MEAT, AND TO INCREASE FISH INTAKE TO TWO DAYS PER WEEK.  MEATS/FISH SHOULD NOT BE FRIED, BAKED OR BROILED IS PREFERABLE.  IT IS ALSO IMPORTANT TO CUT BACK ON YOUR SUGAR INTAKE. PLEASE AVOID ANYTHING WITH ADDED SUGAR, CORN SYRUP OR OTHER SWEETENERS. IF YOU MUST USE A SWEETENER, YOU CAN TRY STEVIA. IT IS  ALSO IMPORTANT TO AVOID ARTIFICIALLY SWEETENERS AND DIET BEVERAGES. LASTLY, I SUGGEST WEARING SPF 50 SUNSCREEN ON EXPOSED PARTS AND ESPECIALLY WHEN IN THE DIRECT SUNLIGHT FOR AN EXTENDED PERIOD OF TIME.  PLEASE AVOID FAST FOOD RESTAURANTS AND INCREASE YOUR WATER INTAKE. - POCT Urinalysis Dipstick (81002)  2. Dyslipidemia associated with type 2 diabetes mellitus ([place]) Comments: Chronic, diabetic foot exam was performed. She will f/u in 4 months, c/w Ozempic once weekly. I DISCUSSED WITH THE PATIENT AT LENGTH REGARDING THE GOALS OF GLYCEMIC CONTROL AND POSSIBLE LONG-TERM COMPLICATIONS.  I  ALSO STRESSED THE IMPORTANCE OF COMPLIANCE WITH HOME GLUCOSE MONITORING, DIETARY RESTRICTIONS INCLUDING AVOIDANCE OF SUGARY DRINKS/PROCESSED FOODS,  ALONG WITH REGULAR EXERCISE.  I  ALSO STRESSED THE IMPORTANCE OF ANNUAL EYE EXAMS, SELF FOOT CARE AND COMPLIANCE WITH OFFICE VISITS. - Urine microalbumin-creatinine with uACR  3. Chronic hypertension Comments: Chronic, fair control. Goal BP<130/80. EKG performed, NSR w/o acute changes. She will c/w valsartan $RemoveBefor'160mg'[id number]$  daily. She will f/u in 4-6 months.  - EKG 12-Lead - CBC no Diff - CMP14+EGFR - Hemoglobin A1c - Lipid panel  4. Right wrist pain Comments: She is advised to apply Voltaren gel to affected area bid-tid prn until she sees Ortho.   Patient was given opportunity to ask questions. Patient verbalized understanding of the plan and was able to repeat key elements of the plan. All questions were answered to their satisfaction.   I, Maximino Greenland, MD, have reviewed all documentation for this visit. The documentation on 02/01/22 for the exam, diagnosis, procedures, and orders are all accurate and complete.   THE PATIENT IS ENCOURAGED TO PRACTICE SOCIAL DISTANCING DUE TO THE COVID-19 PANDEMIC.

## 2022-02-02 LAB — CMP14+EGFR
ALT: 41 IU/L — ABNORMAL HIGH (ref 0–32)
AST: 34 IU/L (ref 0–40)
Albumin/Globulin Ratio: 2 (ref 1.2–2.2)
Albumin: 4.6 g/dL (ref 3.9–4.9)
Alkaline Phosphatase: 110 IU/L (ref 44–121)
BUN/Creatinine Ratio: 13 (ref 12–28)
BUN: 11 mg/dL (ref 8–27)
Bilirubin Total: 0.5 mg/dL (ref 0.0–1.2)
CO2: 22 mmol/L (ref 20–29)
Calcium: 8.9 mg/dL (ref 8.7–10.3)
Chloride: 106 mmol/L (ref 96–106)
Creatinine, Ser: 0.85 mg/dL (ref 0.57–1.00)
Globulin, Total: 2.3 g/dL (ref 1.5–4.5)
Glucose: 108 mg/dL — ABNORMAL HIGH (ref 70–99)
Potassium: 4 mmol/L (ref 3.5–5.2)
Sodium: 142 mmol/L (ref 134–144)
Total Protein: 6.9 g/dL (ref 6.0–8.5)
eGFR: 78 mL/min/{1.73_m2} (ref >59)

## 2022-02-02 LAB — CBC
Hematocrit: 43.1 % (ref 34.0–46.6)
Hemoglobin: 14.8 g/dL (ref 11.1–15.9)
MCH: 30.6 pg (ref 26.6–33.0)
MCHC: 34.3 g/dL (ref 31.5–35.7)
MCV: 89 fL (ref 79–97)
Platelets: 218 10*3/uL (ref 150–450)
RBC: 4.83 x10E6/uL (ref 3.77–5.28)
RDW: 12.7 % (ref 11.7–15.4)
WBC: 6.2 10*3/uL (ref 3.4–10.8)

## 2022-02-02 LAB — HEMOGLOBIN A1C
Est. average glucose Bld gHb Est-mCnc: 126 mg/dL
Hgb A1c MFr Bld: 6 % — ABNORMAL HIGH (ref 4.8–5.6)

## 2022-02-02 LAB — LIPID PANEL
Chol/HDL Ratio: 2.5 ratio (ref 0.0–4.4)
Cholesterol, Total: 164 mg/dL (ref 100–199)
HDL: 65 mg/dL (ref >39)
LDL Chol Calc (NIH): 83 mg/dL (ref 0–99)
Triglycerides: 85 mg/dL (ref 0–149)
VLDL Cholesterol Cal: 16 mg/dL (ref 5–40)

## 2022-02-02 LAB — MICROALBUMIN / CREATININE URINE RATIO
Creatinine, Urine: 283.5 mg/dL
Microalb/Creat Ratio: 8 mg/g creat (ref 0–29)
Microalbumin, Urine: 22.2 ug/mL

## 2022-02-11 LAB — HM PAP SMEAR: HM Pap smear: NORMAL

## 2022-03-04 ENCOUNTER — Encounter: Payer: Self-pay | Admitting: Plastic Surgery

## 2022-03-04 ENCOUNTER — Ambulatory Visit (INDEPENDENT_AMBULATORY_CARE_PROVIDER_SITE_OTHER): Payer: Self-pay | Admitting: Plastic Surgery

## 2022-03-04 DIAGNOSIS — D1801 Hemangioma of skin and subcutaneous tissue: Secondary | ICD-10-CM

## 2022-03-04 NOTE — Progress Notes (Signed)
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 Consent was confirmed and signed. Eye protection was placed. Time out was called and all information was confirmed to be correct. The area  area was prepped with alcohol and wiped dry. The BBL laser was set at 560 nm and  15 J/cm2. The lip was lasered. The patient tolerated the procedure well and there were no complications. The patient is to follow up in 4 weeks.

## 2022-03-08 ENCOUNTER — Ambulatory Visit
Admission: RE | Admit: 2022-03-08 | Discharge: 2022-03-08 | Disposition: A | Discharge status: Home / Self Care | Payer: BC Managed Care – PPO | Source: Ambulatory Visit | Attending: Obstetrics and Gynecology | Admitting: Obstetrics and Gynecology

## 2022-03-08 DIAGNOSIS — Z1231 Encounter for screening mammogram for malignant neoplasm of breast: Secondary | ICD-10-CM

## 2022-04-18 ENCOUNTER — Encounter: Payer: Self-pay | Admitting: Internal Medicine

## 2022-04-19 ENCOUNTER — Ambulatory Visit (INDEPENDENT_AMBULATORY_CARE_PROVIDER_SITE_OTHER): Payer: Self-pay | Admitting: Plastic Surgery

## 2022-04-19 DIAGNOSIS — D1801 Hemangioma of skin and subcutaneous tissue: Secondary | ICD-10-CM

## 2022-04-19 NOTE — Progress Notes (Signed)
Preoperative Dx: angioma of lip  Postoperative Dx:  same  Procedure: laser to lower lip angioma 3 mm   Anesthesia: none  Description of Procedure:  Risks and complications were explained to the patient. Consent was confirmed and signed. Eye protection was placed. Time out was called and all information was confirmed to be correct. The area  area was prepped with alcohol and wiped dry. The BBL laser was set at 560 nm at 12 J/cm2. The lower lip was lasered. The patient tolerated the procedure well and there were no complications. The patient is to follow up in 4 weeks.

## 2022-04-24 ENCOUNTER — Other Ambulatory Visit: Payer: Self-pay | Admitting: Internal Medicine

## 2022-04-30 LAB — HM DIABETES EYE EXAM

## 2022-05-09 ENCOUNTER — Encounter: Payer: Self-pay | Admitting: Internal Medicine

## 2022-05-12 ENCOUNTER — Ambulatory Visit: Payer: BC Managed Care – PPO | Admitting: Internal Medicine

## 2022-05-19 ENCOUNTER — Ambulatory Visit: Payer: BC Managed Care – PPO | Admitting: Internal Medicine

## 2022-05-19 ENCOUNTER — Encounter: Payer: Self-pay | Admitting: Internal Medicine

## 2022-05-19 VITALS — BP 134/66 | HR 82 | Temp 97.8°F | Ht 65.0 in | Wt 197.0 lb

## 2022-05-19 DIAGNOSIS — E785 Hyperlipidemia, unspecified: Secondary | ICD-10-CM

## 2022-05-19 DIAGNOSIS — E1169 Type 2 diabetes mellitus with other specified complication: Secondary | ICD-10-CM

## 2022-05-19 DIAGNOSIS — N952 Postmenopausal atrophic vaginitis: Secondary | ICD-10-CM

## 2022-05-19 DIAGNOSIS — Z23 Encounter for immunization: Secondary | ICD-10-CM

## 2022-05-19 DIAGNOSIS — E6609 Other obesity due to excess calories: Secondary | ICD-10-CM

## 2022-05-19 DIAGNOSIS — Z6832 Body mass index (BMI) 32.0-32.9, adult: Secondary | ICD-10-CM

## 2022-05-19 DIAGNOSIS — I10 Essential (primary) hypertension: Secondary | ICD-10-CM

## 2022-05-19 NOTE — Progress Notes (Signed)
Barnet Glasgow Martin,acting as a Education administrator for Maximino Greenland, MD.,have documented all relevant documentation on the behalf of Maximino Greenland, MD,as directed by  Maximino Greenland, MD while in the presence of Maximino Greenland, MD.    Subjective:     Patient ID: Nichole George , female    DOB: 1961-05-03 , 61 y.o.   MRN: [id number]   Chief Complaint  Patient presents with   Diabetes   Hypertension    HPI  Patient presents today for a DM/BP check, patient reports compliance with medications. However, her insurance is no longer paying for Ozempic, despite records showing A1c greater than 6.5. Therefore, she has been out of meds. She denies having any headaches, chest pain and shortness of breath. She is currently under the care of GYN for atrophic vaginitis.       Diabetes She presents for her initial diabetic visit. She has type 2 diabetes mellitus. The initial diagnosis of diabetes was made 4 weeks ago. There are no hypoglycemic associated symptoms. Pertinent negatives for diabetes include no blurred vision and no chest pain. There are no hypoglycemic complications. Risk factors for coronary artery disease include diabetes mellitus, hypertension, post-menopausal and obesity. She is following a diabetic diet. She participates in exercise three times a week. Eye exam is current.  Hypertension This is a chronic problem. The problem has been gradually improving since onset. The problem is uncontrolled. Pertinent negatives include no blurred vision, chest pain, palpitations or shortness of breath. The current treatment provides moderate improvement. Compliance problems include exercise.      Past Medical History:  Diagnosis Date   Arthritis    left ankle and right big toe and both knees   Cervical dysplasia, moderate    Hyperlipidemia    Hypertension    Type 2 diabetes mellitus (HCC)    diet  and exercise controlled     Family History  Problem Relation Age of Onset   Healthy Mother     Pneumonia Father    Breast cancer Sister 98   Cancer Other    Hypertension Other    Hyperlipidemia Other      Current Outpatient Medications:    Ascorbic Acid (VITAMIN C) POWD, Take by mouth. 1/2 tsp daily , Disp: , Rfl:    aspirin 81 MG chewable tablet, Monday and Wednesday and Friday, Disp: , Rfl:    atorvastatin (LIPITOR) 40 MG tablet, TAKE 1 TABLET DAILY MONDAY THROUGH SATURDAY, SKIP SUNDAYS, Disp: 90 tablet, Rfl: 1   Cholecalciferol (VITAMIN D3) 50 MCG (2000 UT) capsule, Take 5,000 Units by mouth daily. , Disp: , Rfl:    Coenzyme Q10 10 MG capsule, , Disp: , Rfl:    fluticasone (FLONASE) 50 MCG/ACT nasal spray, SPRAY ONE SPRAY INTO EACH NOSTRIL DAILY, Disp: 48 mL, Rfl: 1   loratadine (CLARITIN) 10 MG tablet, Take 1 tablet (10 mg total) by mouth daily., Disp: 90 tablet, Rfl: 1   MAGNESIUM PO, , Disp: , Rfl:    valsartan (DIOVAN) 160 MG tablet, TAKE 1 TABLET BY MOUTH EVERY DAY, Disp: 90 tablet, Rfl: 2   hydrocortisone-pramoxine (PROCTOFOAM HC) rectal foam, Place 1 applicator rectally 2 (two) times daily. (Patient not taking: Reported on 05/19/2022), Disp: 10 g, Rfl: 0   tirzepatide (MOUNJARO) 7.5 MG/0.5ML Pen, Inject 7.5 mg into the skin once a week., Disp: 2 mL, Rfl: 0   No Known Allergies   Review of Systems  Constitutional: Negative.   HENT: Negative.    Eyes: Negative.  Negative for blurred vision.  Respiratory: Negative.  Negative for shortness of breath.   Cardiovascular: Negative.  Negative for chest pain and palpitations.  Gastrointestinal: Negative.      Today's Vitals   05/19/22 1201  BP: 134/66  Pulse: 82  Temp: 97.8 F (36.6 C)  TempSrc: Oral  Weight: 197 lb (89.4 kg)  Height: _0  (1.651 m)  PainSc: 0-No pain   Body mass index is 32.78 kg/m.  Wt Readings from Last 3 Encounters:  05/19/22 197 lb (89.4 kg)  02/01/22 206 lb 3.2 oz (93.5 kg)  01/05/22 205 lb 12.8 oz (93.4 kg)   BP Readings from Last 3 Encounters:  05/19/22 134/66  02/01/22 134/68   01/05/22 (!) 144/83    Objective:  Physical Exam Vitals and nursing note reviewed.  Constitutional:      Appearance: Normal appearance.  HENT:     Head: Normocephalic and atraumatic.  Cardiovascular:     Rate and Rhythm: Normal rate and regular rhythm.     Heart sounds: Normal heart sounds.  Pulmonary:     Effort: Pulmonary effort is normal.     Breath sounds: Normal breath sounds.  Musculoskeletal:     Cervical back: Normal range of motion.  Skin:    General: Skin is warm.  Neurological:     General: No focal deficit present.     Mental Status: She is alert.  Psychiatric:        Mood and Affect: Mood normal.        Behavior: Behavior normal.       Assessment And Plan:     1. Dyslipidemia associated with type 2 diabetes mellitus ([place]) Comments: Chronic, I will check labs as below. I will request her eye exam from [facility]. I will make further recommendations once labs avail for review. - Hemoglobin A1c - CMP14+EGFR - TSH  2. Chronic hypertension Comments: Chronic, goal  BP less than 130/80. She admits she has been exercising less due to recent wrist surgery.  3. Atrophic vaginitis Comments: I will request recent GYN records. She is now using estradiol vaginally once weekly.  4. Class 1 obesity due to excess calories with serious comorbidity and body mass index (BMI) of 32.0 to 32.9 in adult Comments: She was congratulated on her 9lb weight loss since August 2023.  Her initial goal is to achive BMI<30 to decrease cardiac risk.  5. Need for influenza vaccination - Flu Vaccine QUAD 6+ mos PF IM (Fluarix Quad PF)    Patient was given opportunity to ask questions. Patient verbalized understanding of the plan and was able to repeat key elements of the plan. All questions were answered to their satisfaction.   I, Maximino Greenland, MD, have reviewed all documentation for this visit. The documentation on 05/19/22 for the exam, diagnosis, procedures, and orders are all  accurate and complete.   IF YOU HAVE BEEN REFERRED TO A SPECIALIST, IT MAY TAKE 1-2 WEEKS TO SCHEDULE/PROCESS THE REFERRAL. IF YOU HAVE NOT HEARD FROM US/SPECIALIST IN TWO WEEKS, PLEASE GIVE [place] A CALL AT [phone] X [phone].   THE PATIENT IS ENCOURAGED TO PRACTICE SOCIAL DISTANCING DUE TO THE COVID-19 PANDEMIC.

## 2022-05-19 NOTE — Patient Instructions (Signed)

## 2022-05-20 ENCOUNTER — Other Ambulatory Visit: Payer: Self-pay | Admitting: Internal Medicine

## 2022-05-20 ENCOUNTER — Encounter: Payer: Self-pay | Admitting: Internal Medicine

## 2022-05-20 ENCOUNTER — Other Ambulatory Visit: Payer: Self-pay

## 2022-05-20 LAB — CMP14+EGFR
ALT: 27 IU/L (ref 0–32)
AST: 25 IU/L (ref 0–40)
Albumin/Globulin Ratio: 1.8 (ref 1.2–2.2)
Albumin: 4.4 g/dL (ref 3.9–4.9)
Alkaline Phosphatase: 150 IU/L — ABNORMAL HIGH (ref 44–121)
BUN/Creatinine Ratio: 14 (ref 12–28)
BUN: 13 mg/dL (ref 8–27)
Bilirubin Total: 0.5 mg/dL (ref 0.0–1.2)
CO2: 22 mmol/L (ref 20–29)
Calcium: 9.6 mg/dL (ref 8.7–10.3)
Chloride: 93 mmol/L — ABNORMAL LOW (ref 96–106)
Creatinine, Ser: 0.93 mg/dL (ref 0.57–1.00)
Globulin, Total: 2.5 g/dL (ref 1.5–4.5)
Glucose: 584 mg/dL (ref 70–99)
Potassium: 4.7 mmol/L (ref 3.5–5.2)
Sodium: 131 mmol/L — ABNORMAL LOW (ref 134–144)
Total Protein: 6.9 g/dL (ref 6.0–8.5)
eGFR: 69 mL/min/{1.73_m2} (ref >59)

## 2022-05-20 LAB — HEMOGLOBIN A1C
Est. average glucose Bld gHb Est-mCnc: 392 mg/dL
Hgb A1c MFr Bld: 15.3 % — ABNORMAL HIGH (ref 4.8–5.6)

## 2022-05-20 LAB — TSH: TSH: 1.79 u[IU]/mL (ref 0.450–4.500)

## 2022-05-20 MED ORDER — MOUNJARO 7.5 MG/0.5ML ~~LOC~~ SOAJ: 7.5000 mg | SUBCUTANEOUS | 0 refills | Status: DC

## 2022-05-21 ENCOUNTER — Encounter: Payer: Self-pay | Admitting: Internal Medicine

## 2022-05-23 ENCOUNTER — Other Ambulatory Visit: Payer: Self-pay | Admitting: Internal Medicine

## 2022-05-23 ENCOUNTER — Ambulatory Visit (INDEPENDENT_AMBULATORY_CARE_PROVIDER_SITE_OTHER): Payer: BC Managed Care – PPO | Admitting: Internal Medicine

## 2022-05-23 ENCOUNTER — Other Ambulatory Visit: Payer: Self-pay

## 2022-05-23 ENCOUNTER — Encounter: Payer: Self-pay | Admitting: Internal Medicine

## 2022-05-23 VITALS — BP 132/80 | HR 85 | Temp 98.5°F | Ht 65.0 in | Wt 195.0 lb

## 2022-05-23 DIAGNOSIS — E1165 Type 2 diabetes mellitus with hyperglycemia: Secondary | ICD-10-CM

## 2022-05-23 MED ORDER — PEN NEEDLES 32G X 4 MM MISC: 2 refills | Status: AC

## 2022-05-23 MED ORDER — BLOOD GLUCOSE MONITORING SUPPL KIT: PACK | 10 refills | Status: DC

## 2022-05-23 MED ORDER — DEXCOM G6 SENSOR MISC: 9 refills | Status: DC

## 2022-05-23 MED ORDER — DEXCOM G6 RECEIVER DEVI: 0 refills | Status: DC

## 2022-05-23 MED ORDER — TRESIBA FLEXTOUCH 100 UNIT/ML ~~LOC~~ SOPN: 6.0000 [IU] | PEN_INJECTOR | Freq: Every day | SUBCUTANEOUS | 1 refills | Status: DC

## 2022-05-23 MED ORDER — DEXCOM G6 TRANSMITTER MISC: 10 refills | Status: DC

## 2022-05-23 MED ORDER — SEMAGLUTIDE (1 MG/DOSE) 4 MG/3ML ~~LOC~~ SOPN: 1.0000 mg | PEN_INJECTOR | SUBCUTANEOUS | 3 refills | Status: DC

## 2022-05-23 NOTE — Patient Instructions (Signed)

## 2022-05-23 NOTE — Progress Notes (Signed)
Rich Brave [place],acting as a Education administrator for Maximino Greenland, MD.,have documented all relevant documentation on the behalf of Maximino Greenland, MD,as directed by  Maximino Greenland, MD while in the presence of Maximino Greenland, MD.    Subjective:     Patient ID: Nichole George , female    DOB: [birth year] , 61 y.o.   MRN: [id number]   Chief Complaint  Patient presents with   Diabetes    HPI  Patient presents today for insulin teaching. She was in last week and labs revealed elevated glucose in 500s and A1c 15%. She admits that she has been drinking juices, eas and sodas liberally. She also admits to a high carb diet. Unfortunately, she had also been out of meds due to lack of coverage of Ozempic. She was called into the office last Friday and given sample of Mounjaro. She started this and did not have any issues with the medication.   She states she feels a lot better since starting the [facility].    Diabetes She presents for her initial diabetic visit. She has type 2 diabetes mellitus. The initial diagnosis of diabetes was made 4 weeks ago. There are no hypoglycemic associated symptoms. Pertinent negatives for diabetes include no blurred vision, no chest pain, no polydipsia, no polyphagia and no polyuria. There are no hypoglycemic complications. Risk factors for coronary artery disease include diabetes mellitus, hypertension, post-menopausal and obesity. She is following a diabetic diet. She participates in exercise three times a week. Eye exam is current.  Hypertension This is a chronic problem. The problem has been gradually improving since onset. The problem is uncontrolled. Pertinent negatives include no blurred vision, chest pain, palpitations or shortness of breath. The current treatment provides moderate improvement. Compliance problems include exercise.      Past Medical History:  Diagnosis Date   Arthritis    left ankle and right big toe and both knees   Cervical dysplasia, moderate     Hyperlipidemia    Hypertension    Type 2 diabetes mellitus (HCC)    diet  and exercise controlled     Family History  Problem Relation Age of Onset   Healthy Mother    Pneumonia Father    Breast cancer Sister 44   Cancer Other    Hypertension Other    Hyperlipidemia Other      Current Outpatient Medications:    Continuous Blood Gluc Receiver (DEXCOM G6 RECEIVER) DEVI, Use to check blood sugar 3 times daily. Dx code: e11.65, Disp: 1 each, Rfl: 0   Continuous Blood Gluc Sensor (DEXCOM G6 SENSOR) MISC, Use to check blood sugar 3 times daily. Dx code: e11.65, Disp: 9 each, Rfl: 9   Continuous Blood Gluc Transmit (DEXCOM G6 TRANSMITTER) MISC, Use to check blood sugar 3 times daily. Dx code: e11.65, Disp: 1 each, Rfl: 10   Insulin Pen Needle (PEN NEEDLES) 32G X 4 MM MISC, Use as directed with insulin, Disp: 100 each, Rfl: 2   Ascorbic Acid (VITAMIN C) POWD, Take by mouth. 1/2 tsp daily , Disp: , Rfl:    aspirin 81 MG chewable tablet, Monday and Wednesday and Friday, Disp: , Rfl:    atorvastatin (LIPITOR) 40 MG tablet, TAKE 1 TABLET DAILY MONDAY THROUGH SATURDAY, SKIP SUNDAYS, Disp: 90 tablet, Rfl: 1   Blood Glucose Monitoring Suppl KIT, Use to check blood sugar 3 times daily. Dx code: e11.65  Dispense based on plan coverage with supplies., Disp: 1 kit, Rfl: 10   Cholecalciferol (  VITAMIN D3) 50 MCG (2000 UT) capsule, Take 5,000 Units by mouth daily. , Disp: , Rfl:    Coenzyme Q10 10 MG capsule, , Disp: , Rfl:    fluticasone (FLONASE) 50 MCG/ACT nasal spray, SPRAY ONE SPRAY INTO EACH NOSTRIL DAILY, Disp: 48 mL, Rfl: 1   hydrocortisone-pramoxine (PROCTOFOAM HC) rectal foam, Place 1 applicator rectally 2 (two) times daily. (Patient not taking: Reported on 05/19/2022), Disp: 10 g, Rfl: 0   insulin degludec (TRESIBA FLEXTOUCH) 100 UNIT/ML FlexTouch Pen, Inject 6 Units into the skin at bedtime. Max dose 60 units, Disp: 3 mL, Rfl: 1   loratadine (CLARITIN) 10 MG tablet, Take 1 tablet (10 mg  total) by mouth daily., Disp: 90 tablet, Rfl: 1   MAGNESIUM PO, , Disp: , Rfl:    Semaglutide, 1 MG/DOSE, 4 MG/3ML SOPN, Inject 1 mg into the skin once a week. Dx code E11.65. A1c is 15, Disp: 3 mL, Rfl: 3   tirzepatide (MOUNJARO) 7.5 MG/0.5ML Pen, Inject 7.5 mg into the skin once a week., Disp: 2 mL, Rfl: 0   valsartan (DIOVAN) 160 MG tablet, TAKE 1 TABLET BY MOUTH EVERY DAY, Disp: 90 tablet, Rfl: 2   No Known Allergies   Review of Systems  Constitutional:  Positive for unexpected weight change.  Eyes: Negative.  Negative for blurred vision.  Respiratory: Negative.  Negative for shortness of breath.   Cardiovascular: Negative.  Negative for chest pain and palpitations.  Gastrointestinal: Negative.   Endocrine: Negative for polydipsia, polyphagia and polyuria.  Musculoskeletal: Negative.   Skin: Negative.   Neurological: Negative.   Psychiatric/Behavioral: Negative.       Today's Vitals   05/23/22 0958  BP: 132/80  Pulse: 85  Temp: 98.5 F (36.9 C)  Weight: 195 lb (88.5 kg)  Height: _0  (1.651 m)  PainSc: 0-No pain   Body mass index is 32.45 kg/m.  Wt Readings from Last 3 Encounters:  05/23/22 195 lb (88.5 kg)  05/19/22 197 lb (89.4 kg)  02/01/22 206 lb 3.2 oz (93.5 kg)     Objective:  Physical Exam Vitals and nursing note reviewed.  Constitutional:      Appearance: Normal appearance.  HENT:     Head: Normocephalic and atraumatic.     Nose:     Comments: Masked     Mouth/Throat:     Comments: Masked  Eyes:     Extraocular Movements: Extraocular movements intact.  Cardiovascular:     Rate and Rhythm: Normal rate and regular rhythm.     Heart sounds: Normal heart sounds.  Pulmonary:     Effort: Pulmonary effort is normal.     Breath sounds: Normal breath sounds.  Musculoskeletal:     Cervical back: Normal range of motion.  Skin:    General: Skin is warm.  Neurological:     General: No focal deficit present.     Mental Status: She is alert.   Psychiatric:        Mood and Affect: Mood normal.        Behavior: Behavior normal.      Assessment And Plan:     1. Poorly controlled diabetes mellitus ([place]) Comments: Her most recent a1c is 15%. She admits to liberal eating/drinking. She was given sample of [place] and will start 6 units nightly. Importance of medication and dietary compliance was stressed to the patient. She was instructed on how to self administer the medication. Advised to call in fasting blood sugars every Monday and Thursdays. Attempted  to set her up for [facility], needs Dexcom 6 since she has Android phone. Will start the process to get this approved by her insurance. In the meantime, she was sent a new meter as requested. She will f/u in six weeks for re-evaluation. She understands that she will require long acting insulin (brand names: Lantus, Semglee, Basaglar, Tyler Aas) to achieve optimal glycemic control.  Long acting insulin acts over 24 hours and is dosed once daily (typically at night). She was instructed how to administer an insulin injection and that pen needles are disposed of in a sharps container. Explained that injection sites include abdomen, outer thighs, back of arms, lower back/upper buttocks. Advised insulin injections must be rotated to prevent insulin mediated lipohypertrophy. All questions were answered to her satisfaction.  - insulin degludec (TRESIBA FLEXTOUCH) 100 UNIT/ML FlexTouch Pen; Inject 6 Units into the skin at bedtime.  Dispense: 3 mL; Refill: 1 - Insulin Pen Needle (PEN NEEDLES) 32G X 4 MM MISC; Use as directed with insulin  Dispense: 100 each; Refill: 2   Patient was given opportunity to ask questions. Patient verbalized understanding of the plan and was able to repeat key elements of the plan. All questions were answered to their satisfaction.   I, Maximino Greenland, MD, have reviewed all documentation for this visit. The documentation on 05/23/22 for the exam, diagnosis, procedures,  and orders are all accurate and complete.   IF YOU HAVE BEEN REFERRED TO A SPECIALIST, IT MAY TAKE 1-2 WEEKS TO SCHEDULE/PROCESS THE REFERRAL. IF YOU HAVE NOT HEARD FROM US/SPECIALIST IN TWO WEEKS, PLEASE GIVE [place] A CALL AT [phone] X [phone].   THE PATIENT IS ENCOURAGED TO PRACTICE SOCIAL DISTANCING DUE TO THE COVID-19 PANDEMIC.

## 2022-05-24 ENCOUNTER — Other Ambulatory Visit: Payer: Self-pay | Admitting: Internal Medicine

## 2022-05-24 DIAGNOSIS — E1165 Type 2 diabetes mellitus with hyperglycemia: Secondary | ICD-10-CM

## 2022-05-25 ENCOUNTER — Other Ambulatory Visit: Payer: Self-pay | Admitting: Internal Medicine

## 2022-05-25 ENCOUNTER — Ambulatory Visit: Payer: BC Managed Care – PPO | Admitting: Internal Medicine

## 2022-05-25 DIAGNOSIS — E1165 Type 2 diabetes mellitus with hyperglycemia: Secondary | ICD-10-CM

## 2022-05-26 ENCOUNTER — Encounter: Payer: Self-pay | Admitting: Internal Medicine

## 2022-05-28 ENCOUNTER — Encounter: Payer: Self-pay | Admitting: Internal Medicine

## 2022-05-29 ENCOUNTER — Encounter: Payer: Self-pay | Admitting: Internal Medicine

## 2022-05-30 ENCOUNTER — Encounter: Payer: Self-pay | Admitting: Internal Medicine

## 2022-05-31 ENCOUNTER — Other Ambulatory Visit: Payer: Self-pay | Admitting: Internal Medicine

## 2022-05-31 ENCOUNTER — Encounter: Payer: Self-pay | Admitting: Internal Medicine

## 2022-05-31 ENCOUNTER — Ambulatory Visit (INDEPENDENT_AMBULATORY_CARE_PROVIDER_SITE_OTHER): Payer: BC Managed Care – PPO | Admitting: Internal Medicine

## 2022-05-31 VITALS — HR 87 | Temp 98.5°F | Ht 65.0 in | Wt 195.0 lb

## 2022-05-31 DIAGNOSIS — R0981 Nasal congestion: Secondary | ICD-10-CM

## 2022-05-31 DIAGNOSIS — J069 Acute upper respiratory infection, unspecified: Secondary | ICD-10-CM | POA: Diagnosis not present

## 2022-05-31 DIAGNOSIS — E1165 Type 2 diabetes mellitus with hyperglycemia: Secondary | ICD-10-CM

## 2022-05-31 LAB — POC INFLUENZA A&B (BINAX/QUICKVUE)
Influenza A, POC: NEGATIVE
Influenza B, POC: NEGATIVE

## 2022-05-31 MED ORDER — HYDROCODONE BIT-HOMATROP MBR 5-1.5 MG/5ML PO SOLN: 5.0000 mL | Freq: Four times a day (QID) | ORAL | 0 refills | Status: DC | PRN

## 2022-05-31 NOTE — Progress Notes (Signed)
Barnet [place],acting as a Education administrator for Maximino Greenland, MD.,have documented all relevant documentation on the behalf of Maximino Greenland, MD,as directed by  Maximino Greenland, MD while in the presence of Maximino Greenland, MD.    Subjective:     Patient ID: Nichole George , female    DOB: 19-May-1961 , 61 y.o.   MRN: [id number]   Chief Complaint  Patient presents with   URI    HPI  Patient presents today for cough, headache, congestion. She did travel to [place] for the Thanksgiving holiday. She admits that she did not wear a mask on the plane. Her sx started last Thursday. She had terrible headache yesterday, denies fever/chills. States cough kept her up all last night. Patients headache has now gone away but patient is still not feeling well. She has performed rapid COVID test which was negative.   URI  This is a new problem. The current episode started in the past 7 days. The problem has been unchanged. There has been no fever. Associated symptoms include congestion and coughing. Pertinent negatives include no swollen glands or vomiting. She has tried antihistamine for the symptoms. The treatment provided no relief.     Past Medical History:  Diagnosis Date   Arthritis    left ankle and right big toe and both knees   Cervical dysplasia, moderate    Hyperlipidemia    Hypertension    Type 2 diabetes mellitus (HCC)    diet  and exercise controlled     Family History  Problem Relation Age of Onset   Healthy Mother    Pneumonia Father    Breast cancer Sister 62   Cancer Other    Hypertension Other    Hyperlipidemia Other      Current Outpatient Medications:    HYDROcodone bit-homatropine (HYDROMET) 5-1.5 MG/5ML syrup, Take 5 mLs by mouth every 6 (six) hours as needed., Disp: 120 mL, Rfl: 0   Ascorbic Acid (VITAMIN C) POWD, Take by mouth. 1/2 tsp daily , Disp: , Rfl:    aspirin 81 MG chewable tablet, Monday and Wednesday and Friday, Disp: , Rfl:    atorvastatin (LIPITOR) 40 MG  tablet, TAKE 1 TABLET DAILY MONDAY THROUGH SATURDAY, SKIP SUNDAYS, Disp: 90 tablet, Rfl: 1   Blood Glucose Monitoring Suppl KIT, Use to check blood sugar 3 times daily. Dx code: e11.65  Dispense based on plan coverage with supplies., Disp: 1 kit, Rfl: 10   Cholecalciferol (VITAMIN D3) 50 MCG (2000 UT) capsule, Take 5,000 Units by mouth daily. , Disp: , Rfl:    Coenzyme Q10 10 MG capsule, , Disp: , Rfl:    Continuous Blood Gluc Receiver (DEXCOM G6 RECEIVER) DEVI, Use to check blood sugar 3 times daily. Dx code: e11.65, Disp: 1 each, Rfl: 0   Continuous Blood Gluc Sensor (DEXCOM G6 SENSOR) MISC, Use to check blood sugar 3 times daily. Dx code: e11.65, Disp: 9 each, Rfl: 9   Continuous Blood Gluc Transmit (DEXCOM G6 TRANSMITTER) MISC, Use to check blood sugar 3 times daily. Dx code: e11.65, Disp: 1 each, Rfl: 10   fluticasone (FLONASE) 50 MCG/ACT nasal spray, SPRAY ONE SPRAY INTO EACH NOSTRIL DAILY, Disp: 48 mL, Rfl: 1   hydrocortisone-pramoxine (PROCTOFOAM HC) rectal foam, Place 1 applicator rectally 2 (two) times daily. (Patient not taking: Reported on 05/19/2022), Disp: 10 g, Rfl: 0   insulin degludec (TRESIBA FLEXTOUCH) 100 UNIT/ML FlexTouch Pen, Inject 6 Units into the skin at bedtime. Max dose 60 units,  Disp: 3 mL, Rfl: 1   Insulin Pen Needle (PEN NEEDLES) 32G X 4 MM MISC, Use as directed with insulin, Disp: 100 each, Rfl: 2   loratadine (CLARITIN) 10 MG tablet, Take 1 tablet (10 mg total) by mouth daily., Disp: 90 tablet, Rfl: 1   MAGNESIUM PO, , Disp: , Rfl:    tirzepatide (MOUNJARO) 7.5 MG/0.5ML Pen, Inject 7.5 mg into the skin once a week., Disp: 2 mL, Rfl: 0   valsartan (DIOVAN) 160 MG tablet, TAKE 1 TABLET BY MOUTH EVERY DAY, Disp: 90 tablet, Rfl: 2   No Known Allergies   Review of Systems  Constitutional:  Positive for chills and fatigue. Negative for fever.  HENT:  Positive for congestion.   Eyes: Negative.   Respiratory:  Positive for cough.   Cardiovascular: Negative.    Gastrointestinal: Negative.  Negative for vomiting.  Neurological: Negative.   Psychiatric/Behavioral: Negative.       Today's Vitals   05/31/22 1429  Pulse: 87  Temp: 98.5 F (36.9 C)  TempSrc: Oral  SpO2: 99%  Weight: 195 lb (88.5 kg)  Height: _0  (1.651 m)  PainSc: 0-No pain   Body mass index is 32.45 kg/m.   Objective:  Physical Exam Vitals and nursing note reviewed.  Constitutional:      Appearance: Normal appearance. She is ill-appearing.  HENT:     Head: Normocephalic and atraumatic.     Nose:     Comments: Masked     Mouth/Throat:     Comments: Masked  Eyes:     Extraocular Movements: Extraocular movements intact.  Cardiovascular:     Rate and Rhythm: Normal rate and regular rhythm.     Heart sounds: Normal heart sounds.  Pulmonary:     Effort: Pulmonary effort is normal.     Breath sounds: Normal breath sounds.  Skin:    General: Skin is warm.  Neurological:     General: No focal deficit present.     Mental Status: She is alert.  Psychiatric:        Mood and Affect: Mood normal.        Behavior: Behavior normal.       Assessment And Plan:     1. Viral URI with cough Comments: FLU A/B neg. She agrees to PCR testing. She will be given work note for 11/27-11/28.  We discussed increased potential for false negative rapid COVID-19 tests early in illness with current circulating strains and PCR testing is recommended. Isolate while awaiting results. Also discussed sx are consistent with viral syndrome - supportive care and contagious nature of illness reviewed. Will prescribe Hydromet syrup as needed for cough. She is alo encouraged to stay well hydrated, rest and take Tylenol prn fever/sore throat.   - Novel Coronavirus, NAA ([facility]) - POC Influenza A&B(BINAX/QUICKVUE)  2. Nasal sinus congestion Comments: Again, she agrees to PCR testing. - Novel Coronavirus, NAA ([facility]) - POC Influenza A&B(BINAX/QUICKVUE)  3. Poorly controlled diabetes mellitus  ([place]) Comments: She has tolerated Mounjaro w/o AE. Mounjaro 7.31m is pending PA, she was given samples of Mounjaro 2.572mand will inject x 2 every Friday(dose equivalent is 60m660m    Patient was given opportunity to ask questions. Patient verbalized understanding of the plan and was able to repeat key elements of the plan. All questions were answered to their satisfaction.   I, RobMaximino GreenlandD, have reviewed all documentation for this visit. The documentation on 05/31/22 for the exam, diagnosis, procedures, and orders are all  accurate and complete.   IF YOU HAVE BEEN REFERRED TO A SPECIALIST, IT MAY TAKE 1-2 WEEKS TO SCHEDULE/PROCESS THE REFERRAL. IF YOU HAVE NOT HEARD FROM US/SPECIALIST IN TWO WEEKS, PLEASE GIVE [place] A CALL AT [phone] X [phone].   THE PATIENT IS ENCOURAGED TO PRACTICE SOCIAL DISTANCING DUE TO THE COVID-19 PANDEMIC.

## 2022-06-01 LAB — NOVEL CORONAVIRUS, NAA: SARS-CoV-2, NAA: NOT DETECTED

## 2022-06-02 ENCOUNTER — Encounter: Payer: Self-pay | Admitting: Internal Medicine

## 2022-06-03 ENCOUNTER — Encounter: Payer: Self-pay | Admitting: Internal Medicine

## 2022-06-10 ENCOUNTER — Ambulatory Visit: Payer: Self-pay | Admitting: Plastic Surgery

## 2022-06-10 ENCOUNTER — Telehealth: Payer: Self-pay | Admitting: Plastic Surgery

## 2022-06-10 NOTE — Telephone Encounter (Signed)
Pt LVM on on office phone very early this morning stating she is very sick and having to cancel.

## 2022-06-20 ENCOUNTER — Encounter: Payer: Self-pay | Admitting: Internal Medicine

## 2022-06-21 ENCOUNTER — Other Ambulatory Visit: Payer: Self-pay | Admitting: Internal Medicine

## 2022-06-21 ENCOUNTER — Encounter: Payer: Self-pay | Admitting: Internal Medicine

## 2022-06-21 ENCOUNTER — Other Ambulatory Visit: Payer: Self-pay

## 2022-06-21 MED ORDER — BLOOD GLUCOSE MONITORING SUPPL KIT: PACK | 10 refills | Status: AC

## 2022-06-22 ENCOUNTER — Emergency Department (HOSPITAL_COMMUNITY): Payer: BC Managed Care – PPO

## 2022-06-22 ENCOUNTER — Other Ambulatory Visit: Payer: Self-pay

## 2022-06-22 ENCOUNTER — Emergency Department (HOSPITAL_BASED_OUTPATIENT_CLINIC_OR_DEPARTMENT_OTHER): Payer: BC Managed Care – PPO

## 2022-06-22 ENCOUNTER — Emergency Department (HOSPITAL_BASED_OUTPATIENT_CLINIC_OR_DEPARTMENT_OTHER)
Admission: EM | Admit: 2022-06-22 | Discharge: 2022-06-22 | Disposition: A | Discharge status: Home / Self Care | Payer: BC Managed Care – PPO | Attending: Emergency Medicine | Admitting: Emergency Medicine

## 2022-06-22 ENCOUNTER — Encounter (HOSPITAL_BASED_OUTPATIENT_CLINIC_OR_DEPARTMENT_OTHER): Payer: Self-pay

## 2022-06-22 DIAGNOSIS — Z7982 Long term (current) use of aspirin: Secondary | ICD-10-CM | POA: Insufficient documentation

## 2022-06-22 DIAGNOSIS — R519 Headache, unspecified: Secondary | ICD-10-CM | POA: Diagnosis not present

## 2022-06-22 DIAGNOSIS — R202 Paresthesia of skin: Secondary | ICD-10-CM | POA: Diagnosis not present

## 2022-06-22 DIAGNOSIS — Z8673 Personal history of transient ischemic attack (TIA), and cerebral infarction without residual deficits: Secondary | ICD-10-CM | POA: Diagnosis not present

## 2022-06-22 DIAGNOSIS — M542 Cervicalgia: Secondary | ICD-10-CM | POA: Diagnosis not present

## 2022-06-22 DIAGNOSIS — Z794 Long term (current) use of insulin: Secondary | ICD-10-CM | POA: Insufficient documentation

## 2022-06-22 DIAGNOSIS — R200 Anesthesia of skin: Secondary | ICD-10-CM | POA: Diagnosis present

## 2022-06-22 LAB — CBC
HCT: 42.9 % (ref 36.0–46.0)
Hemoglobin: 14.2 g/dL (ref 12.0–15.0)
MCH: 30.4 pg (ref 26.0–34.0)
MCHC: 33.1 g/dL (ref 30.0–36.0)
MCV: 91.9 fL (ref 80.0–100.0)
Platelets: 178 10*3/uL (ref 150–400)
RBC: 4.67 MIL/uL (ref 3.87–5.11)
RDW: 12.6 % (ref 11.5–15.5)
WBC: 5.6 10*3/uL (ref 4.0–10.5)
nRBC: 0 % (ref 0.0–0.2)

## 2022-06-22 LAB — DIFFERENTIAL
Abs Immature Granulocytes: 0.01 10*3/uL (ref 0.00–0.07)
Basophils Absolute: 0 10*3/uL (ref 0.0–0.1)
Basophils Relative: 0 %
Eosinophils Absolute: 0.3 10*3/uL (ref 0.0–0.5)
Eosinophils Relative: 5 %
Immature Granulocytes: 0 %
Lymphocytes Relative: 32 %
Lymphs Abs: 1.8 10*3/uL (ref 0.7–4.0)
Monocytes Absolute: 0.6 10*3/uL (ref 0.1–1.0)
Monocytes Relative: 10 %
Neutro Abs: 2.9 10*3/uL (ref 1.7–7.7)
Neutrophils Relative %: 53 %

## 2022-06-22 LAB — APTT: aPTT: 26 seconds (ref 24–36)

## 2022-06-22 LAB — COMPREHENSIVE METABOLIC PANEL
ALT: 26 U/L (ref 0–44)
AST: 32 U/L (ref 15–41)
Albumin: 4.4 g/dL (ref 3.5–5.0)
Alkaline Phosphatase: 78 U/L (ref 38–126)
Anion gap: 9 (ref 5–15)
BUN: 10 mg/dL (ref 8–23)
CO2: 25 mmol/L (ref 22–32)
Calcium: 9.2 mg/dL (ref 8.9–10.3)
Chloride: 106 mmol/L (ref 98–111)
Creatinine, Ser: 0.66 mg/dL (ref 0.44–1.00)
GFR, Estimated: >60 mL/min (ref >60)
Glucose, Bld: 81 mg/dL (ref 70–99)
Potassium: 3.9 mmol/L (ref 3.5–5.1)
Sodium: 140 mmol/L (ref 135–145)
Total Bilirubin: 0.6 mg/dL (ref 0.3–1.2)
Total Protein: 7.3 g/dL (ref 6.5–8.1)

## 2022-06-22 LAB — PROTIME-INR
INR: 1.1 (ref 0.8–1.2)
Prothrombin Time: 13.6 seconds (ref 11.4–15.2)

## 2022-06-22 LAB — CBG MONITORING, ED: Glucose-Capillary: 78 mg/dL (ref 70–99)

## 2022-06-22 LAB — ETHANOL: Alcohol, Ethyl (B): 11 mg/dL — ABNORMAL HIGH (ref <10)

## 2022-06-22 MED ORDER — SODIUM CHLORIDE 0.9% FLUSH: 3.0000 mL | Freq: Once | INTRAVENOUS | Status: DC

## 2022-06-22 NOTE — ED Provider Notes (Signed)
[place] EMERGENCY DEPT Provider Note   CSN: [id number] Arrival date & time: 06/22/22  1724     History  Chief Complaint  Patient presents with   Facial Numbness    Nichole George is a 61 y.o. female who presents to the ED with concerns for left sided facial numbness onset 3 PM today.  Has a history of Bell's palsy and notes that her left-sided facial numbness feel similar to her history of Bell's palsy.  Also notes associated left arm numbness, left-sided neck pain.  No recent injury, fall, trauma.  Notes that she went to the chiropractor yesterday prior to onset of her symptoms where she had a massage completed.  There was no manipulation of her spine at that time.  Denies chest pain or shortness of breath.  The history is provided by the patient. No language interpreter was used.       Home Medications Prior to Admission medications   Medication Sig Start Date End Date Taking? Authorizing Provider  Ascorbic Acid (VITAMIN C) POWD Take by mouth. 1/2 tsp daily     [provider]  aspirin 81 MG chewable tablet Monday and Wednesday and Friday    [provider]  atorvastatin (LIPITOR) 40 MG tablet TAKE 1 TABLET DAILY [place], SKIP SUNDAYS 04/25/22   Glendale Chard, MD  Blood Glucose Monitoring Suppl KIT Use to check blood sugar 3 times daily. Dx code: e11.65 Dispense based on plan coverage with supplies. 06/21/22   Glendale Chard, MD  Cholecalciferol (VITAMIN D3) 50 MCG (2000 UT) capsule Take 5,000 Units by mouth daily.     [provider]  Coenzyme Q10 10 MG capsule     [provider]  Continuous Blood Gluc Receiver (DEXCOM G6 RECEIVER) DEVI Use to check blood sugar 3 times daily. Dx code: e11.65 05/23/22   Glendale Chard, MD  Continuous Blood Gluc Sensor (DEXCOM G6 SENSOR) MISC Use to check blood sugar 3 times daily. Dx code: e11.65 05/23/22   Glendale Chard, MD  Continuous Blood Gluc Transmit (DEXCOM G6  TRANSMITTER) MISC Use to check blood sugar 3 times daily. Dx code: e11.65 05/23/22   Glendale Chard, MD  fluticasone [facility]) 50 MCG/ACT nasal spray SPRAY ONE SPRAY INTO EACH NOSTRIL DAILY 12/06/21   Glendale Chard, MD  HYDROcodone bit-homatropine (HYDROMET) 5-1.5 MG/5ML syrup Take 5 mLs by mouth every 6 (six) hours as needed. 05/31/22   Glendale Chard, MD  hydrocortisone-pramoxine (PROCTOFOAM [facility]) rectal foam Place 1 applicator rectally 2 (two) times daily. Patient not taking: Reported on 05/19/2022 01/05/22   Glendale Chard, MD  insulin degludec (TRESIBA FLEXTOUCH) 100 UNIT/ML FlexTouch Pen Inject 6 Units into the skin at bedtime. Max dose 60 units 05/23/22   Glendale Chard, MD  Insulin Pen Needle (PEN NEEDLES) 32G X 4 MM MISC Use as directed with insulin 05/23/22   Glendale Chard, MD  loratadine (CLARITIN) 10 MG tablet Take 1 tablet (10 mg total) by mouth daily. 09/21/20   Glendale Chard, MD  MAGNESIUM PO     [provider]  tirzepatide [facility]) 7.5 MG/0.5ML Pen Inject 7.5 mg into the skin once a week. 05/20/22   Glendale Chard, MD  valsartan (DIOVAN) 160 MG tablet TAKE 1 TABLET BY MOUTH EVERY DAY 11/15/21   Glendale Chard, MD      Allergies    Patient has no known allergies.    Review of Systems   Review of Systems  All other systems reviewed and are negative.   Physical  Exam Updated Vital Signs BP 136/89   Pulse 65   Temp 97.9 F (36.6 C) (Oral)   Resp 18   Ht _0  (1.651 m)   Wt 84.6 kg   SpO2 100%   BMI 31.04 kg/m  Physical Exam Vitals and nursing note reviewed.  Constitutional:      General: She is not in acute distress.    Appearance: She is not diaphoretic.  HENT:     Head: Normocephalic and atraumatic.     Mouth/Throat:     Pharynx: No oropharyngeal exudate.  Eyes:     General: No scleral icterus.    Conjunctiva/sclera: Conjunctivae normal.  Cardiovascular:     Rate and Rhythm: Normal rate and regular rhythm.     Pulses: Normal pulses.     Heart  sounds: Normal heart sounds.  Pulmonary:     Effort: Pulmonary effort is normal. No respiratory distress.     Breath sounds: Normal breath sounds. No wheezing.  Abdominal:     General: Bowel sounds are normal.     Palpations: Abdomen is soft. There is no mass.     Tenderness: There is no abdominal tenderness. There is no guarding or rebound.  Musculoskeletal:        General: Normal range of motion.     Cervical back: Normal range of motion and neck supple.  Skin:    General: Skin is warm and dry.  Neurological:     Mental Status: She is alert.     Comments: No focal neurological deficits. Negative pronator drift. Able to ambulate without assistance or difficulty. Strength and sensation intact to BUE and BLE. Grip strength 5/5 bilaterally.  Normal finger-nose testing.  Normal heel-to-shin testing.  Cranial nerves II through XII intact.  Psychiatric:        Behavior: Behavior normal.     ED Results / Procedures / Treatments   Labs (all labs ordered are listed, but only abnormal results are displayed) Labs Reviewed  ETHANOL - Abnormal; Notable for the following components:      Result Value   Alcohol, Ethyl (B) 11 (*)    All other components within normal limits  PROTIME-INR  APTT  CBC  DIFFERENTIAL  COMPREHENSIVE METABOLIC PANEL  CBG MONITORING, ED  CBG MONITORING, ED    EKG None  Radiology CT HEAD CODE STROKE WO CONTRAST  Result Date: 06/22/2022 CLINICAL DATA:  Code stroke. Acute neuro deficit. Left facial numbness and left arm numbness EXAM: CT HEAD WITHOUT CONTRAST TECHNIQUE: Contiguous axial images were obtained from the base of the skull through the vertex without intravenous contrast. RADIATION DOSE REDUCTION: This exam was performed according to the departmental dose-optimization program which includes automated exposure control, adjustment of the mA and/or kV according to patient size and/or use of iterative reconstruction technique. COMPARISON:  CT head 01/19/2006  FINDINGS: Brain: No evidence of acute infarction, hemorrhage, hydrocephalus, extra-axial collection or mass lesion/mass effect. Vascular: Negative for hyperdense vessel Skull: Negative Sinuses/Orbits: Paranasal sinuses clear.  Negative orbit Other: None ASPECTS ([place] Stroke Program Early CT Score) - Ganglionic level infarction (caudate, lentiform nuclei, internal capsule, insula, M1-M3 cortex): 7 - Supraganglionic infarction (M4-M6 cortex): 3 Total score (0-10 with 10 being normal): 10 IMPRESSION: 1. Negative CT head. 2. Aspects is 10. 3. Code stroke imaging results were communicated on 06/22/2022 at 6:04 pm to provider Quinn Axe via [facility] text page Electronically Signed   By: Franchot Gallo M.D.   On: 06/22/2022 18:04    Procedures Procedures  Medications Ordered in ED Medications  sodium chloride flush (NS) 0.9 % injection 3 mL (has no administration in time range)    ED Course/ Medical Decision Making/ A&P Clinical Course as of 06/22/22 2219  Wed Jun 22, 2022  2211 Attending evaluated patient and agreeable with discharge home with pcp follow up. [SB]  2219 Discussed with patient plans for discharge.  Answered all of her questions.  Patient appears safe for discharge at this time. [SB]    Clinical Course User Index [SB] Deveion Denz A, PA-C                           Medical Decision Making Amount and/or Complexity of Data Reviewed Labs: ordered. Radiology: ordered.   Pt presents with concerns for left-sided facial numbness onset 3 PM today.  Has a history of Bell's palsy.  No recent injury, fall, trauma.  Patient afebrile.  On exam patient without focal neurological deficits.  No acute cardiovascular respiratory exam findings.  Differential diagnosis includes CVA, TIA, cervical radiculopathy, dissection.    Labs:  I ordered, and personally interpreted labs.  The pertinent results include:   CMP, CBC, CBG, coags unremarkable EtOH slightly elevated 11  Imaging: I ordered imaging  studies including CT head without I independently visualized and interpreted imaging which showed: No acute findings I agree with the radiologist interpretation   Disposition: Presentation suspicious for facial numbness at this time.  Doubt concerns at this time for CVA or TIA.  Doubt concerns for dissection at this time, patient did not have any manipulation of the spine.  Case discussed with attending who evaluated patient and agrees with discharge treatment plan.  After consideration of the diagnostic results and the patients response to treatment, I feel that the patient would benefit from Discharge home. Supportive care measures and strict return precautions discussed with patient at bedside. Pt acknowledges and verbalizes understanding. Pt appears safe for discharge. Follow up as indicated in discharge paperwork.    This chart was dictated using voice recognition software, Dragon. Despite the best efforts of this provider to proofread and correct errors, errors may still occur which can change documentation meaning.  Final Clinical Impression(s) / ED Diagnoses Final diagnoses:  Left facial numbness    Rx / DC Orders ED Discharge Orders     None         Grantham Hippert A, PA-C 06/22/22 2220    Charlesetta Shanks, MD 07/05/22 1506

## 2022-06-22 NOTE — Consult Note (Signed)
NEUROLOGY TELECONSULTATION NOTE   Date of service: June 22, 2022 Patient Name: Nichole George MRN:  [id number] DOB:  02/24/1961 Reason for consult: L facial numbness and L arm pain  Requesting Provider: Dr. Charlesetta Shanks Consult Participants: myself, patient, bedside RN, telestroke RN Location of the provider: [facility], [place] Location of the patient: [facility]  This consult was provided via telemedicine with 2-way video and audio communication. The patient/family was informed that care would be provided in this way and agreed to receive care in this manner.   _ _ _   _ __   _ __ _ _  __ __   _ __   __ _  History of Present Illness   This is a 61 yo woman with hx HTN, DM2, and HL who presents with shooting pains in LUE and facial numbness. Last known well 1500 today after which time she developed shooting "nerve pain" and paresthesias down her LUE. She also had some slight numbness in her L face which has now transitioned to a mild diffuse headache. 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  NIHSS = 1 for sensory change. CT head no acute intracranial process on personal review. TNK not administered and no further imaging performed 2/2 symptoms not being consistent with stroke.   ROS   Per HPI; all other systems reviewed and are negative  Past History   The following was personally reviewed:  Past Medical History:  Diagnosis Date   Arthritis    left ankle and right big toe and both knees   Cervical dysplasia, moderate    Hyperlipidemia    Hypertension    Type 2 diabetes mellitus ([place])    diet  and exercise controlled   Past Surgical History:  Procedure Laterality Date   BREAST CYST ASPIRATION Left    CERVICAL CONIZATION W/BX N/A 04/19/2018   Procedure: CONIZATION CERVIX WITH BIOPSY;  Surgeon: Eldred Manges, MD;  Location: [place];  Service: Gynecology;  Laterality: N/A;   CESAREAN SECTION      CHOLECYSTECTOMY     TUBAL LIGATION     Family History  Problem Relation Age of Onset   Healthy Mother    Pneumonia Father    Breast cancer Sister 39   Cancer Other    Hypertension Other    Hyperlipidemia Other    Social History   Socioeconomic History   Marital status: Single    Spouse name: Not on file   Number of children: Not on file   Years of education: Not on file   Highest education level: Not on file  Occupational History   Not on file  Tobacco Use   Smoking status: Never   Smokeless tobacco: Never  Vaping Use   Vaping Use: Never used  Substance and Sexual Activity   Alcohol use: Yes    Comment: occ wine   Drug use: Never   Sexual activity: Not on file  Other Topics Concern   Not on file  Social History Narrative   Not on file   Social Determinants of Health   Financial Resource Strain: Not on file  Food Insecurity: Not on file  Transportation Needs: Not on file  Physical Activity: Not on file  Stress: Not on file  Social Connections: Not on file   No Known Allergies  Medications   (Not in a hospital admission)     Current Facility-Administered Medications:    sodium chloride flush (  NS) 0.9 % injection 3 mL, 3 mL, Intravenous, Once, Pfeiffer, Marcy, MD  Current Outpatient Medications:    Ascorbic Acid (VITAMIN C) POWD, Take by mouth. 1/2 tsp daily , Disp: , Rfl:    aspirin 81 MG chewable tablet, Monday and Wednesday and Friday, Disp: , Rfl:    atorvastatin (LIPITOR) 40 MG tablet, TAKE 1 TABLET DAILY MONDAY THROUGH SATURDAY, SKIP SUNDAYS, Disp: 90 tablet, Rfl: 1   Blood Glucose Monitoring Suppl KIT, Use to check blood sugar 3 times daily. Dx code: e11.65 Dispense based on plan coverage with supplies., Disp: 1 kit, Rfl: 10   Cholecalciferol (VITAMIN D3) 50 MCG (2000 UT) capsule, Take 5,000 Units by mouth daily. , Disp: , Rfl:    Coenzyme Q10 10 MG capsule, , Disp: , Rfl:    Continuous Blood Gluc Receiver (DEXCOM G6 RECEIVER) DEVI, Use to check  blood sugar 3 times daily. Dx code: e11.65, Disp: 1 each, Rfl: 0   Continuous Blood Gluc Sensor (DEXCOM G6 SENSOR) MISC, Use to check blood sugar 3 times daily. Dx code: e11.65, Disp: 9 each, Rfl: 9   Continuous Blood Gluc Transmit (DEXCOM G6 TRANSMITTER) MISC, Use to check blood sugar 3 times daily. Dx code: e11.65, Disp: 1 each, Rfl: 10   fluticasone (FLONASE) 50 MCG/ACT nasal spray, SPRAY ONE SPRAY INTO EACH NOSTRIL DAILY, Disp: 48 mL, Rfl: 1   HYDROcodone bit-homatropine (HYDROMET) 5-1.5 MG/5ML syrup, Take 5 mLs by mouth every 6 (six) hours as needed., Disp: 120 mL, Rfl: 0   hydrocortisone-pramoxine (PROCTOFOAM HC) rectal foam, Place 1 applicator rectally 2 (two) times daily. (Patient not taking: Reported on 05/19/2022), Disp: 10 g, Rfl: 0   insulin degludec (TRESIBA FLEXTOUCH) 100 UNIT/ML FlexTouch Pen, Inject 6 Units into the skin at bedtime. Max dose 60 units, Disp: 3 mL, Rfl: 1   Insulin Pen Needle (PEN NEEDLES) 32G X 4 MM MISC, Use as directed with insulin, Disp: 100 each, Rfl: 2   loratadine (CLARITIN) 10 MG tablet, Take 1 tablet (10 mg total) by mouth daily., Disp: 90 tablet, Rfl: 1   MAGNESIUM PO, , Disp: , Rfl:    tirzepatide (MOUNJARO) 7.5 MG/0.5ML Pen, Inject 7.5 mg into the skin once a week., Disp: 2 mL, Rfl: 0   valsartan (DIOVAN) 160 MG tablet, TAKE 1 TABLET BY MOUTH EVERY DAY, Disp: 90 tablet, Rfl: 2  Vitals   Vitals:   06/22/22 1731 06/22/22 1739  BP: 136/89   Pulse: 65   Resp: 18   Temp: 97.9 F (36.6 C)   TempSrc: Oral   SpO2: 100%   Weight:  84.6 kg  Height:  _0  (1.651 m)     Body mass index is 31.04 kg/m.  Physical Exam   Exam performed over telemedicine with 2-way video and audio communication and with assistance of bedside RN  Physical Exam Gen: A&O x4, NAD Resp: normal WOB CV: extremities appear well-perfused  Neuro: *MS: A&O x4. Follows multi-step commands.  *Speech: nondysarthric, no aphasia, able to name and repeat *CN: PERRL 7m, EOMI, VFF  by confrontation, mild sensory deficit L face, smile symmetric, hearing intact to voice *Motor:   Normal bulk.  No tremor, rigidity or bradykinesia. No pronator drift. All extremities appear full-strength and symmetric. *Sensory: Subjective paresthesias L hand, otherwise intact to LT. No double-simultaneous extinction.  *Coordination:  Finger-to-nose, heel-to-shin, rapid alternating motions were intact. *Reflexes:  UTA 2/2 tele-exam *Gait: deferred  NIHSS = 1 for sensory  Premorbid mRS = 0   Labs  CBC:  Recent Labs  Lab 06/22/22 1824  WBC 5.6  NEUTROABS 2.9  HGB 14.2  HCT 42.9  MCV 91.9  PLT 572    Basic Metabolic Panel:  Lab Results  Component Value Date   NA 140 06/22/2022   K 3.9 06/22/2022   CO2 25 06/22/2022   GLUCOSE 81 06/22/2022   BUN 10 06/22/2022   CREATININE 0.66 06/22/2022   CALCIUM 9.2 06/22/2022   GFRNONAA >60 06/22/2022   GFRAA 79 05/21/2020   Lipid Panel:  Lab Results  Component Value Date   LDLCALC 83 02/01/2022   HgbA1c:  Lab Results  Component Value Date   HGBA1C 15.3 (H) 05/19/2022   Urine Drug Screen: No results found for: "LABOPIA", "COCAINSCRNUR", "LABBENZ", "AMPHETMU", "THCU", "LABBARB"  Alcohol Level     Component Value Date/Time   ETH 11 (H) 06/22/2022 1824     Impression   This is a 61 yo woman with hx HTN, DM2, and HL who presents with shooting pains in LUE and facial numbness. Last known well 1500 today after which time she developed shooting "nerve pain" and paresthesias down her LUE. She also had some slight numbness in her L face which has now transitioned to a mild diffuse headache. NIHSS = 1 for sensory change. CT head no acute intracranial process on personal review. Stroke would not explain patient's chief complaint of shooting pains down her LUE therefore TNK not administered and no further stroke workup is recommended.  Recommendations   - No further stroke workup recommended - Neurology will be available prn for  questions ______________________________________________________________________   Thank you for the opportunity to take part in the care of this patient. If you have any further questions, please contact the neurology consultation attending.  Signed,  Su Monks, MD [facility] [phone]  If 7pm- 7am, please page neurology on call as listed in [place].  **Any copied and pasted documentation in this note was written by me in another application not billed for and pasted by me into this document.

## 2022-06-22 NOTE — ED Triage Notes (Signed)
Patient here POV from Home.  Endorses Fatigue that began at 1500. Associated with left Sided Facial Numbness and Left Arm Numbness.   Shooting Pains in Neck as well. History of Bells palsy in 2007.   NAD Noted during Triage. A&Ox4. GCS 15. Ambulatory.

## 2022-06-22 NOTE — ED Notes (Signed)
[facility] activated.  Pt in Ct at time of activation.  Report provided by ED staff reports left side numbness with LKWT of 1500 1746 Neuro paged 1748 Dr Quinn Axe joins cart [phone]  Dr Quinn Axe performing NIH

## 2022-06-22 NOTE — Discharge Instructions (Addendum)
It was a pleasure taking care of you today!  Your CT scan did not show any concerning findings today.  Your labs were overall unremarkable today.  Attached is information for the orthopedist to follow-up as needed.  You may follow-up with your primary care provider regarding today's ED visit.  Return to the emergency department if you are experiencing increasing/worsening symptoms.

## 2022-06-28 ENCOUNTER — Encounter: Payer: Self-pay | Admitting: Internal Medicine

## 2022-07-15 ENCOUNTER — Encounter: Payer: Self-pay | Admitting: Internal Medicine

## 2022-07-18 ENCOUNTER — Encounter: Payer: Self-pay | Admitting: Internal Medicine

## 2022-07-26 ENCOUNTER — Encounter: Payer: Self-pay | Admitting: Internal Medicine

## 2022-07-26 ENCOUNTER — Ambulatory Visit: Payer: BC Managed Care – PPO | Admitting: Internal Medicine

## 2022-07-26 VITALS — BP 122/80 | HR 90 | Temp 98.1°F | Ht 65.0 in | Wt 190.6 lb

## 2022-07-26 DIAGNOSIS — E1169 Type 2 diabetes mellitus with other specified complication: Secondary | ICD-10-CM

## 2022-07-26 DIAGNOSIS — I10 Essential (primary) hypertension: Secondary | ICD-10-CM | POA: Diagnosis not present

## 2022-07-26 DIAGNOSIS — E6609 Other obesity due to excess calories: Secondary | ICD-10-CM

## 2022-07-26 DIAGNOSIS — Z6831 Body mass index (BMI) 31.0-31.9, adult: Secondary | ICD-10-CM

## 2022-07-26 DIAGNOSIS — E785 Hyperlipidemia, unspecified: Secondary | ICD-10-CM

## 2022-07-26 LAB — HEMOGLOBIN A1C
Est. average glucose Bld gHb Est-mCnc: 177 mg/dL
Hgb A1c MFr Bld: 7.8 % — ABNORMAL HIGH (ref 4.8–5.6)

## 2022-07-26 NOTE — Progress Notes (Signed)
Rich Brave [place],acting as a Education administrator for Maximino Greenland, MD.,have documented all relevant documentation on the behalf of Maximino Greenland, MD,as directed by  Maximino Greenland, MD while in the presence of Maximino Greenland, MD.    Subjective:     Patient ID: Nichole George , female    DOB: [birth year] , 62 y.o.   MRN: [id number]   Chief Complaint  Patient presents with   Diabetes   Hypertension    HPI  Patient presents today for a DM/BP check, patient reports compliance with medications.  Patient reports her blood sugar was 93 this morning. She denies having headaches, chest pain and shortness of breath.      Diabetes She presents for her initial diabetic visit. She has type 2 diabetes mellitus. The initial diagnosis of diabetes was made 4 weeks ago. There are no hypoglycemic associated symptoms. Pertinent negatives for diabetes include no blurred vision and no chest pain. There are no hypoglycemic complications. Risk factors for coronary artery disease include diabetes mellitus, hypertension, post-menopausal and obesity. She is following a diabetic diet. She participates in exercise three times a week. Eye exam is current.  Hypertension This is a chronic problem. The problem has been gradually improving since onset. The problem is uncontrolled. Pertinent negatives include no blurred vision, chest pain, palpitations or shortness of breath. The current treatment provides moderate improvement. Compliance problems include exercise.      Past Medical History:  Diagnosis Date   Arthritis    left ankle and right big toe and both knees   Cervical dysplasia, moderate    Hyperlipidemia    Hypertension    Type 2 diabetes mellitus ([facility])    diet  and exercise controlled     Family History  Problem Relation Age of Onset   Healthy Mother    Pneumonia Father    Breast cancer Sister 41   Cancer Other    Hypertension Other    Hyperlipidemia Other      Current Outpatient Medications:     Ascorbic Acid (VITAMIN C) POWD, Take by mouth. 1/2 tsp daily , Disp: , Rfl:    aspirin 81 MG chewable tablet, Monday and Wednesday and Friday, Disp: , Rfl:    atorvastatin (LIPITOR) 40 MG tablet, TAKE 1 TABLET DAILY MONDAY THROUGH SATURDAY, SKIP SUNDAYS, Disp: 90 tablet, Rfl: 1   Blood Glucose Monitoring Suppl KIT, Use to check blood sugar 3 times daily. Dx code: e11.65 Dispense based on plan coverage with supplies., Disp: 1 kit, Rfl: 10   Cholecalciferol (VITAMIN D3) 50 MCG (2000 UT) capsule, Take 5,000 Units by mouth daily. , Disp: , Rfl:    Coenzyme Q10 10 MG capsule, , Disp: , Rfl:    fluticasone (FLONASE) 50 MCG/ACT nasal spray, SPRAY ONE SPRAY INTO EACH NOSTRIL DAILY, Disp: 48 mL, Rfl: 1   insulin degludec (TRESIBA FLEXTOUCH) 100 UNIT/ML FlexTouch Pen, Inject 6 Units into the skin at bedtime. Max dose 60 units (Patient taking differently: Inject 10 Units into the skin at bedtime. Max dose 60 units), Disp: 3 mL, Rfl: 1   Insulin Pen Needle (PEN NEEDLES) 32G X 4 MM MISC, Use as directed with insulin, Disp: 100 each, Rfl: 2   loratadine (CLARITIN) 10 MG tablet, Take 1 tablet (10 mg total) by mouth daily., Disp: 90 tablet, Rfl: 1   MAGNESIUM PO, , Disp: , Rfl:    Semaglutide, 1 MG/DOSE, (OZEMPIC, 1 MG/DOSE,) 4 MG/3ML SOPN, Inject 1 mg into the skin once a week.,  Disp: , Rfl:    valsartan (DIOVAN) 160 MG tablet, TAKE 1 TABLET BY MOUTH EVERY DAY, Disp: 90 tablet, Rfl: 2   Continuous Blood Gluc Receiver (DEXCOM G6 RECEIVER) DEVI, Use to check blood sugar 3 times daily. Dx code: e11.65 (Patient not taking: Reported on 07/26/2022), Disp: 1 each, Rfl: 0   Continuous Blood Gluc Sensor (DEXCOM G6 SENSOR) MISC, Use to check blood sugar 3 times daily. Dx code: e11.65 (Patient not taking: Reported on 07/26/2022), Disp: 9 each, Rfl: 9   Continuous Blood Gluc Transmit (DEXCOM G6 TRANSMITTER) MISC, Use to check blood sugar 3 times daily. Dx code: e11.65 (Patient not taking: Reported on 07/26/2022), Disp: 1 each,  Rfl: 10   No Known Allergies   Review of Systems  Constitutional: Negative.   Eyes:  Negative for blurred vision.  Respiratory: Negative.  Negative for shortness of breath.   Cardiovascular: Negative.  Negative for chest pain and palpitations.  Gastrointestinal: Negative.   Neurological: Negative.   Psychiatric/Behavioral: Negative.       Today's Vitals   07/26/22 0850  BP: 122/80  Pulse: 90  Temp: 98.1 F (36.7 C)  Weight: 190 lb 9.6 oz (86.5 kg)  Height: '5\' 5"'$  (1.651 m)  PainSc: 0-No pain   Body mass index is 31.72 kg/m.  Wt Readings from Last 3 Encounters:  07/26/22 190 lb 9.6 oz (86.5 kg)  06/22/22 186 lb 8 oz (84.6 kg)  05/31/22 195 lb (88.5 kg)     Objective:  Physical Exam Vitals and nursing note reviewed.  Constitutional:      Appearance: Normal appearance.  HENT:     Head: Normocephalic and atraumatic.     Nose:     Comments: Masked     Mouth/Throat:     Comments: Masked  Eyes:     Extraocular Movements: Extraocular movements intact.  Cardiovascular:     Rate and Rhythm: Normal rate and regular rhythm.     Heart sounds: Normal heart sounds.  Pulmonary:     Effort: Pulmonary effort is normal.     Breath sounds: Normal breath sounds.  Musculoskeletal:     Cervical back: Normal range of motion.  Skin:    General: Skin is warm.  Neurological:     General: No focal deficit present.     Mental Status: She is alert.  Psychiatric:        Mood and Affect: Mood normal.        Behavior: Behavior normal.      Assessment And Plan:     1. Dyslipidemia associated with type 2 diabetes mellitus ([place]) Comments: Chronic, LDL goal <70. She will c/w Ozempic, I hope to wean her off of [place]. We also discussed use of SGLT2inh in DM control. She was given samples of [place]. Risk of UTI and yeast infection was discussed. If started, she will rto in 4 weeks for labwork. Otherwise, she will f/u in 3 months for re-evaluation.  - Hemoglobin A1c  2. Chronic  hypertension Comments: Chronic, controlled. She will c/w valsartan '160mg'$  daily. Encouraged to follow a low sodium diet. Advised to aim for 150 minutes of exercise/week.  3. Class 1 obesity due to excess calories with serious comorbidity and body mass index (BMI) of 31.0 to 31.9 in adult Comments: She is encouraged to aim for at least 150 minutes of exercise per week, while striving for BMI<30 to decrease cardiac risk.  Patient was given opportunity to ask questions. Patient verbalized understanding of the plan and was  able to repeat key elements of the plan. All questions were answered to their satisfaction.   I, Maximino Greenland, MD, have reviewed all documentation for this visit. The documentation on 07/26/22 for the exam, diagnosis, procedures, and orders are all accurate and complete.   IF YOU HAVE BEEN REFERRED TO A SPECIALIST, IT MAY TAKE 1-2 WEEKS TO SCHEDULE/PROCESS THE REFERRAL. IF YOU HAVE NOT HEARD FROM US/SPECIALIST IN TWO WEEKS, PLEASE GIVE [place] A CALL AT [phone] X [phone].   THE PATIENT IS ENCOURAGED TO PRACTICE SOCIAL DISTANCING DUE TO THE COVID-19 PANDEMIC.

## 2022-07-26 NOTE — Patient Instructions (Addendum)
Dapagliflozin Tablets What is this medication? DAPAGLIFLOZIN (DAP a gli FLOE zin) treats type 2 diabetes. It works by helping your kidneys remove sugar (glucose) from your blood through the urine, which decreases your blood sugar. It may also be used to lower the risk of worsening disease and death caused by kidney disease and heart failure. It works by helping your kidneys remove salt (sodium) from your blood through the urine. This decreases the amount of work the kidneys and heart have to do. Changes to diet and exercise are often combined with this medication. This medicine may be used for other purposes; ask your health care provider or pharmacist if you have questions. COMMON BRAND NAME(S): Farxiga What should I tell my care team before I take this medication? They need to know if you have any of these conditions: Dehydration Diabetic ketoacidosis Diet low in salt Eating less due to illness, surgery, dieting, or any other reason Frequently drink alcohol Having surgery History of pancreatitis or pancreas problems History of yeast infection of the penis or vagina Infection in the bladder, kidneys, or urinary tract Kidney disease Low blood pressure On dialysis Problems urinating Type 1 diabetes Uncircumcised female An unusual or allergic reaction to dapagliflozin, other medications, foods, dyes, or preservatives Pregnant or trying to get pregnant Breast-feeding How should I use this medication? Take this medication by mouth with water. Take it as directed on the prescription label at the same time every day. You can take it with or without food. If it upsets your stomach, take it with food. Keep taking it unless your care team tells you to stop. A special MedGuide will be given to you by the pharmacist with each prescription and refill. Be sure to read this information carefully each time. Talk to your care team about the use of this medication in children. Special care may be  needed. Overdosage: If you think you have taken too much of this medicine contact a poison control center or emergency room at once. NOTE: This medicine is only for you. Do not share this medicine with others. What if I miss a dose? If you miss a dose, take it as soon as you can. If it is almost time for your next dose, take only that dose. Do not take double or extra doses. What may interact with this medication? Lithium Sulfonylureas, such as glimepiride, glipizide, glyburide This list may not describe all possible interactions. Give your health care provider a list of all the medicines, herbs, non-prescription drugs, or dietary supplements you use. Also tell them if you smoke, drink alcohol, or use illegal drugs. Some items may interact with your medicine. What should I watch for while using this medication? Visit your care team for regular checks on your progress. Tell your care team if your symptoms do not start to get better or if they get worse. This medication can cause a serious condition in which there is too much acid in the blood. If you develop nausea, vomiting, stomach pain, unusual tiredness, or breathing problems, stop taking this medication and call your care team right away. If possible, use a ketone dipstick to check for ketones in your urine. Check with your care team if you have severe diarrhea, nausea, and vomiting, or if you sweat a lot. The loss of too much body fluid may make it dangerous for you to take this medication. A test called the HbA1C (A1C) will be monitored. This is a simple blood test. It measures your blood sugar   control over the last 2 to 3 months. You will receive this test every 3 to 6 months. Learn how to check your blood sugar. Learn the symptoms of low and high blood sugar and how to manage them. Always carry a quick-source of sugar with you in case you have symptoms of low blood sugar. Examples include hard sugar candy or glucose tablets. Make sure others  know that you can choke if you eat or drink when you develop serious symptoms of low blood sugar, such as seizures or unconsciousness. Get medical help at once. Tell your care team if you have high blood sugar. You might need to change the dose of your medication. If you are sick or exercising more than usual, you may need to change the dose of your medication. Do not skip meals. Ask your care team if you should avoid alcohol. Many nonprescription cough and cold products contain sugar or alcohol. These can affect blood sugar. Wear a medical ID bracelet or chain. Carry a card that describes your condition. List the medications and doses you take on the card. What side effects may I notice from receiving this medication? Side effects that you should report to your care team as soon as possible: Allergic reactions--skin rash, itching, hives, swelling of the face, lips, tongue, or throat Dehydration--increased thirst, dry mouth, feeling faint or lightheaded, headache, dark yellow or brown urine Diabetic ketoacidosis (DKA)--increased thirst or amount of urine, dry mouth, fatigue, fruity odor to breath, trouble breathing, stomach pain, nausea, vomiting Genital yeast infection--redness, swelling, pain, or itchiness, odor, thick or lumpy discharge New pain or tenderness, change in skin color, sores or ulcers, infection of the leg or foot Infection or redness, swelling, tenderness, or pain in the genitals, or area from the genitals to the back of the rectum Urinary tract infection (UTI)--burning when passing urine, passing frequent small amounts of urine, bloody or cloudy urine, pain in the lower back or sides This list may not describe all possible side effects. Call your doctor for medical advice about side effects. You may report side effects to FDA at 1-800-FDA-1088. Where should I keep my medication? Keep out of the reach of children and pets. Store at room temperature between 20 and 25 degrees C (68 and  77 degrees F). Get rid of any unused medication after the expiration date. To get rid of medications that are no longer needed or have expired: Take the medication to a medication take-back program. Check with your pharmacy or law enforcement to find a location. If you cannot return the medication, check the label or package insert to see if the medication should be thrown out in the garbage or flushed down the toilet. If you are not sure, ask your care team. If it is safe to put it in the trash, take the medication out of the container. Mix the medication with cat litter, dirt, coffee grounds, or other unwanted substance. Seal the mixture in a bag or container. Put it in the trash. NOTE: This sheet is a summary. It may not cover all possible information. If you have questions about this medicine, talk to your doctor, pharmacist, or health care provider.  2023 Elsevier/Gold Standard (2020-09-02 00:00:00)   Diabetes Mellitus and Nutrition, Adult When you have diabetes, or diabetes mellitus, it is very important to have healthy eating habits because your blood sugar (glucose) levels are greatly affected by what you eat and drink. Eating healthy foods in the right amounts, at about the same times  every day, can help you: Manage your blood glucose. Lower your risk of heart disease. Improve your blood pressure. Reach or maintain a healthy weight. What can affect my meal plan? Every person with diabetes is different, and each person has different needs for a meal plan. Your health care provider may recommend that you work with a dietitian to make a meal plan that is best for you. Your meal plan may vary depending on factors such as: The calories you need. The medicines you take. Your weight. Your blood glucose, blood pressure, and cholesterol levels. Your activity level. Other health conditions you have, such as heart or kidney disease. How do carbohydrates affect me? Carbohydrates, also called  carbs, affect your blood glucose level more than any other type of food. Eating carbs raises the amount of glucose in your blood. It is important to know how many carbs you can safely have in each meal. This is different for every person. Your dietitian can help you calculate how many carbs you should have at each meal and for each snack. How does alcohol affect me? Alcohol can cause a decrease in blood glucose (hypoglycemia), especially if you use insulin or take certain diabetes medicines by mouth. Hypoglycemia can be a life-threatening condition. Symptoms of hypoglycemia, such as sleepiness, dizziness, and confusion, are similar to symptoms of having too much alcohol. Do not drink alcohol if: Your health care provider tells you not to drink. You are pregnant, may be pregnant, or are planning to become pregnant. If you drink alcohol: Limit how much you have to: 0-1 drink a day for women. 0-2 drinks a day for men. Know how much alcohol is in your drink. In the [place] one drink equals one 12 oz bottle of beer (355 mL), one 5 oz glass of wine (148 mL), or one 1 oz glass of hard liquor (44 mL). Keep yourself hydrated with water, diet soda, or unsweetened iced tea. Keep in mind that regular soda, juice, and other mixers may contain a lot of sugar and must be counted as carbs. What are tips for following this plan?  Reading food labels Start by checking the serving size on the Nutrition Facts label of packaged foods and drinks. The number of calories and the amount of carbs, fats, and other nutrients listed on the label are based on one serving of the item. Many items contain more than one serving per package. Check the total grams (g) of carbs in one serving. Check the number of grams of saturated fats and trans fats in one serving. Choose foods that have a low amount or none of these fats. Check the number of milligrams (mg) of salt (sodium) in one serving. Most people should limit total sodium  intake to less than 2,300 mg per day. Always check the nutrition information of foods labeled as "low-fat" or "nonfat." These foods may be higher in added sugar or refined carbs and should be avoided. Talk to your dietitian to identify your daily goals for nutrients listed on the label. Shopping Avoid buying canned, pre-made, or processed foods. These foods tend to be high in fat, sodium, and added sugar. Shop around the outside edge of the grocery store. This is where you will most often find fresh fruits and vegetables, bulk grains, fresh meats, and fresh dairy products. Cooking Use low-heat cooking methods, such as baking, instead of high-heat cooking methods, such as deep frying. Cook using healthy oils, such as olive, canola, or sunflower oil. Avoid cooking with  butter, cream, or high-fat meats. Meal planning Eat meals and snacks regularly, preferably at the same times every day. Avoid going long periods of time without eating. Eat foods that are high in fiber, such as fresh fruits, vegetables, beans, and whole grains. Eat 4-6 oz (112-168 g) of lean protein each day, such as lean meat, chicken, fish, eggs, or tofu. One ounce (oz) (28 g) of lean protein is equal to: 1 oz (28 g) of meat, chicken, or fish. 1 egg.  cup (62 g) of tofu. Eat some foods each day that contain healthy fats, such as avocado, nuts, seeds, and fish. What foods should I eat? Fruits Berries. Apples. Oranges. Peaches. Apricots. Plums. Grapes. Mangoes. Papayas. Pomegranates. Kiwi. Cherries. Vegetables Leafy greens, including lettuce, spinach, kale, chard, collard greens, mustard greens, and cabbage. Beets. Cauliflower. Broccoli. Carrots. Green beans. Tomatoes. Peppers. Onions. Cucumbers. Brussels sprouts. Grains Whole grains, such as whole-wheat or whole-grain bread, crackers, tortillas, cereal, and pasta. Unsweetened oatmeal. Quinoa. Brown or wild rice. Meats and other proteins Seafood. Poultry without skin. Lean  cuts of poultry and beef. Tofu. Nuts. Seeds. Dairy Low-fat or fat-free dairy products such as milk, yogurt, and cheese. The items listed above may not be a complete list of foods and beverages you can eat and drink. Contact a dietitian for more information. What foods should I avoid? Fruits Fruits canned with syrup. Vegetables Canned vegetables. Frozen vegetables with butter or cream sauce. Grains Refined white flour and flour products such as bread, pasta, snack foods, and cereals. Avoid all processed foods. Meats and other proteins Fatty cuts of meat. Poultry with skin. Breaded or fried meats. Processed meat. Avoid saturated fats. Dairy Full-fat yogurt, cheese, or milk. Beverages Sweetened drinks, such as soda or iced tea. The items listed above may not be a complete list of foods and beverages you should avoid. Contact a dietitian for more information. Questions to ask a health care provider Do I need to meet with a certified diabetes care and education specialist? Do I need to meet with a dietitian? What number can I call if I have questions? When are the best times to check my blood glucose? Where to find more information: American Diabetes Association: diabetes.org Academy of Nutrition and Dietetics: eatright.[employer] of Diabetes and Digestive and Kidney Diseases: [employer] Association of Diabetes Care & Education Specialists: diabeteseducator.org Summary It is important to have healthy eating habits because your blood sugar (glucose) levels are greatly affected by what you eat and drink. It is important to use alcohol carefully. A healthy meal plan will help you manage your blood glucose and lower your risk of heart disease. Your health care provider may recommend that you work with a dietitian to make a meal plan that is best for you. This information is not intended to replace advice given to you by your health care provider. Make sure you discuss any  questions you have with your health care provider. Document Revised: 01/22/2020 Document Reviewed: 01/22/2020 Elsevier Patient Education  [place].

## 2022-07-27 ENCOUNTER — Ambulatory Visit: Payer: BC Managed Care – PPO | Admitting: Podiatry

## 2022-07-27 ENCOUNTER — Encounter: Payer: Self-pay | Admitting: Podiatry

## 2022-07-27 DIAGNOSIS — Z79899 Other long term (current) drug therapy: Secondary | ICD-10-CM

## 2022-07-27 DIAGNOSIS — B351 Tinea unguium: Secondary | ICD-10-CM

## 2022-07-27 DIAGNOSIS — L600 Ingrowing nail: Secondary | ICD-10-CM | POA: Diagnosis not present

## 2022-07-27 MED ORDER — TERBINAFINE HCL 250 MG PO TABS: 250.0000 mg | ORAL_TABLET | Freq: Every day | ORAL | 0 refills | Status: DC

## 2022-07-27 NOTE — Progress Notes (Signed)
Subjective:  Patient ID: Nichole George, female    DOB: 21-Mar-1961,  MRN: [id number]  Chief Complaint  Patient presents with   Nail Problem    62 y.o. female presents with the above complaint.  Patient presents with left hallux medial border ingrown.  Patient states is painful to touch is progressive gotten worse worse with ambulation worse with pressure she would like to have it removed.  She also has no fungus to the right hallux.  She would like to discuss treatment options for that.  She has done over-the-counter medication none of which has helped.  She already had a liver test which was normal.  She is a diabetic   Review of Systems: Negative except as noted in the HPI. Denies N/V/F/Ch.  Past Medical History:  Diagnosis Date   Arthritis    left ankle and right big toe and both knees   Cervical dysplasia, moderate    Hyperlipidemia    Hypertension    Type 2 diabetes mellitus (HCC)    diet  and exercise controlled    Current Outpatient Medications:    terbinafine (LAMISIL) 250 MG tablet, Take 1 tablet (250 mg total) by mouth daily., Disp: 90 tablet, Rfl: 0   Ascorbic Acid (VITAMIN C) POWD, Take by mouth. 1/2 tsp daily , Disp: , Rfl:    aspirin 81 MG chewable tablet, Monday and Wednesday and Friday, Disp: , Rfl:    atorvastatin (LIPITOR) 40 MG tablet, TAKE 1 TABLET DAILY MONDAY THROUGH SATURDAY, SKIP SUNDAYS, Disp: 90 tablet, Rfl: 1   Blood Glucose Monitoring Suppl KIT, Use to check blood sugar 3 times daily. Dx code: e11.65 Dispense based on plan coverage with supplies., Disp: 1 kit, Rfl: 10   Cholecalciferol (VITAMIN D3) 50 MCG (2000 UT) capsule, Take 5,000 Units by mouth daily. , Disp: , Rfl:    Coenzyme Q10 10 MG capsule, , Disp: , Rfl:    Continuous Blood Gluc Receiver (DEXCOM G6 RECEIVER) DEVI, Use to check blood sugar 3 times daily. Dx code: e11.65 (Patient not taking: Reported on 07/26/2022), Disp: 1 each, Rfl: 0   Continuous Blood Gluc Sensor (DEXCOM G6 SENSOR) MISC, Use  to check blood sugar 3 times daily. Dx code: e11.65 (Patient not taking: Reported on 07/26/2022), Disp: 9 each, Rfl: 9   Continuous Blood Gluc Transmit (DEXCOM G6 TRANSMITTER) MISC, Use to check blood sugar 3 times daily. Dx code: e11.65 (Patient not taking: Reported on 07/26/2022), Disp: 1 each, Rfl: 10   fluticasone (FLONASE) 50 MCG/ACT nasal spray, SPRAY ONE SPRAY INTO EACH NOSTRIL DAILY, Disp: 48 mL, Rfl: 1   insulin degludec (TRESIBA FLEXTOUCH) 100 UNIT/ML FlexTouch Pen, Inject 6 Units into the skin at bedtime. Max dose 60 units (Patient taking differently: Inject 10 Units into the skin at bedtime. Max dose 60 units), Disp: 3 mL, Rfl: 1   Insulin Pen Needle (PEN NEEDLES) 32G X 4 MM MISC, Use as directed with insulin, Disp: 100 each, Rfl: 2   loratadine (CLARITIN) 10 MG tablet, Take 1 tablet (10 mg total) by mouth daily., Disp: 90 tablet, Rfl: 1   MAGNESIUM PO, , Disp: , Rfl:    Semaglutide, 1 MG/DOSE, (OZEMPIC, 1 MG/DOSE,) 4 MG/3ML SOPN, Inject 1 mg into the skin once a week., Disp: , Rfl:    valsartan (DIOVAN) 160 MG tablet, TAKE 1 TABLET BY MOUTH EVERY DAY, Disp: 90 tablet, Rfl: 2  Social History   Tobacco Use  Smoking Status Never  Smokeless Tobacco Never    No  Known Allergies Objective:  There were no vitals filed for this visit. There is no height or weight on file to calculate BMI. Constitutional Well developed. Well nourished.  Vascular Dorsalis pedis pulses palpable bilaterally. Posterior tibial pulses palpable bilaterally. Capillary refill normal to all digits.  No cyanosis or clubbing noted. Pedal hair growth normal.  Neurologic Normal speech. Oriented to person, place, and time. Epicritic sensation to light touch grossly present bilaterally.  Dermatologic Painful ingrowing nail at medial nail borders of the hallux nail left. No other open wounds. No skin lesions.  Orthopedic: Normal joint ROM without pain or crepitus bilaterally. No visible deformities. No bony  tenderness.   Radiographs: None Assessment:   1. Ingrown left big toenail   2. High risk medication use   3. Nail fungus   4. Onychomycosis due to dermatophyte    Plan:  Patient was evaluated and treated and all questions answered.  Right hallux onychomycosis -Educated the patient on the etiology of onychomycosis and various treatment options associated with improving the fungal load.  I explained to the patient that there is 3 treatment options available to treat the onychomycosis including topical, p.o., laser treatment.  Patient elected to undergo p.o. options with Lamisil/terbinafine therapy.  In order for me to start the medication therapy, I explained to the patient the importance of evaluating the liver and obtaining the liver function test.  Once the liver function test comes back normal I will start him on 14-monthcourse of Lamisil therapy.  Patient understood all risk and would like to proceed with Lamisil therapy.  I have asked the patient to immediately stop the Lamisil therapy if she has any reactions to it and call the office or go to the emergency room right away.  Patient states understanding   Ingrown Nail, left -Patient elects to proceed with minor surgery to remove ingrown toenail removal today. Consent reviewed and signed by patient. -Ingrown nail excised. See procedure note. -Educated on post-procedure care including soaking. Written instructions provided and reviewed. -Patient to follow up in 2 weeks for nail check.  Procedure: Excision of Ingrown Toenail Location: Left 1st toe medial nail borders. Anesthesia: Lidocaine 1% plain; 1.5 mL and Marcaine 0.5% plain; 1.5 mL, digital block. Skin Prep: Betadine. Dressing: Silvadene; telfa; dry, sterile, compression dressing. Technique: Following skin prep, the toe was exsanguinated and a tourniquet was secured at the base of the toe. The affected nail border was freed, split with a nail splitter, and excised. Chemical  matrixectomy was then performed with phenol and irrigated out with alcohol. The tourniquet was then removed and sterile dressing applied. Disposition: Patient tolerated procedure well. Patient to return in 2 weeks for follow-up.   No follow-ups on file.  Left hallux medial ingrown  Right hallux liver function already done.  Lamisil dispensed

## 2022-07-27 NOTE — Patient Instructions (Signed)

## 2022-08-31 ENCOUNTER — Other Ambulatory Visit: Payer: Self-pay | Admitting: Internal Medicine

## 2022-09-02 ENCOUNTER — Encounter: Payer: BC Managed Care – PPO | Admitting: Plastic Surgery

## 2022-09-05 ENCOUNTER — Encounter: Payer: Self-pay | Admitting: Internal Medicine

## 2022-09-08 ENCOUNTER — Ambulatory Visit: Payer: BC Managed Care – PPO | Admitting: Internal Medicine

## 2022-09-08 ENCOUNTER — Encounter: Payer: Self-pay | Admitting: Internal Medicine

## 2022-09-08 ENCOUNTER — Other Ambulatory Visit: Payer: Self-pay

## 2022-09-08 VITALS — BP 124/82 | HR 74 | Temp 98.1°F | Ht 65.0 in | Wt 187.4 lb

## 2022-09-08 DIAGNOSIS — E6609 Other obesity due to excess calories: Secondary | ICD-10-CM

## 2022-09-08 DIAGNOSIS — E1169 Type 2 diabetes mellitus with other specified complication: Secondary | ICD-10-CM

## 2022-09-08 DIAGNOSIS — I10 Essential (primary) hypertension: Secondary | ICD-10-CM | POA: Diagnosis not present

## 2022-09-08 DIAGNOSIS — E785 Hyperlipidemia, unspecified: Secondary | ICD-10-CM | POA: Diagnosis not present

## 2022-09-08 DIAGNOSIS — Z6831 Body mass index (BMI) 31.0-31.9, adult: Secondary | ICD-10-CM

## 2022-09-08 MED ORDER — DEXCOM G6 TRANSMITTER MISC: 10 refills | Status: AC

## 2022-09-08 NOTE — Progress Notes (Signed)
I,Nichole George,acting as a scribe for Nichole Greenland, MD.,have documented all relevant documentation on the behalf of Nichole Greenland, MD,as directed by  Nichole Greenland, MD while in the presence of Nichole Greenland, MD.    Subjective:     Patient ID: Nichole George , female    DOB: 08/02/1960 , 62 y.o.   MRN: [id number]   Chief Complaint  Patient presents with   Diabetes   Hypertension    HPI  Pt presents today for kidney function & A1c check. She also reports she decided to stop taking the Farxiga samples given at previous visit. She reports medication made her feel "funny".    Diabetes She presents for her initial diabetic visit. She has type 2 diabetes mellitus. The initial diagnosis of diabetes was made 4 weeks ago. There are no hypoglycemic associated symptoms. Pertinent negatives for diabetes include no blurred vision and no chest pain. There are no hypoglycemic complications. Risk factors for coronary artery disease include diabetes mellitus, hypertension, post-menopausal and obesity. She is following a diabetic diet. She participates in exercise three times a week. Eye exam is current.  Hypertension This is a chronic problem. The problem has been gradually improving since onset. The problem is uncontrolled. Pertinent negatives include no blurred vision, chest pain, palpitations or shortness of breath. The current treatment provides moderate improvement. Compliance problems include exercise.      Past Medical History:  Diagnosis Date   Arthritis    left ankle and right big toe and both knees   Cervical dysplasia, moderate    Hyperlipidemia    Hypertension    Type 2 diabetes mellitus (HCC)    diet  and exercise controlled     Family History  Problem Relation Age of Onset   Healthy Mother    Pneumonia Father    Breast cancer Sister 37   Cancer Other    Hypertension Other    Hyperlipidemia Other      Current Outpatient Medications:    Ascorbic Acid (VITAMIN  C) POWD, Take by mouth. 1/2 tsp daily , Disp: , Rfl:    aspirin 81 MG chewable tablet, Monday and Wednesday and Friday, Disp: , Rfl:    atorvastatin (LIPITOR) 40 MG tablet, TAKE 1 TABLET DAILY MONDAY THROUGH SATURDAY, SKIP SUNDAYS, Disp: 90 tablet, Rfl: 1   Blood Glucose Monitoring Suppl KIT, Use to check blood sugar 3 times daily. Dx code: e11.65 Dispense based on plan coverage with supplies., Disp: 1 kit, Rfl: 10   Cholecalciferol (VITAMIN D3) 50 MCG (2000 UT) capsule, Take 5,000 Units by mouth daily. , Disp: , Rfl:    Coenzyme Q10 10 MG capsule, , Disp: , Rfl:    Continuous Blood Gluc Receiver (DEXCOM G6 RECEIVER) DEVI, USE TO CHECK BLOOD SUGAR 3 TIMES DAILY. DX CODE: E11.65, Disp: 1 each, Rfl: 0   fluticasone (FLONASE) 50 MCG/ACT nasal spray, SPRAY ONE SPRAY INTO EACH NOSTRIL DAILY, Disp: 48 mL, Rfl: 1   insulin degludec (TRESIBA FLEXTOUCH) 100 UNIT/ML FlexTouch Pen, Inject 6 Units into the skin at bedtime. Max dose 60 units (Patient taking differently: Inject 10 Units into the skin at bedtime. Max dose 60 units), Disp: 3 mL, Rfl: 1   Insulin Pen Needle (PEN NEEDLES) 32G X 4 MM MISC, Use as directed with insulin, Disp: 100 each, Rfl: 2   loratadine (CLARITIN) 10 MG tablet, Take 1 tablet (10 mg total) by mouth daily., Disp: 90 tablet, Rfl: 1   MAGNESIUM PO, , Disp: ,  Rfl:    Semaglutide, 1 MG/DOSE, (OZEMPIC, 1 MG/DOSE,) 4 MG/3ML SOPN, Inject 1 mg into the skin once a week., Disp: , Rfl:    terbinafine (LAMISIL) 250 MG tablet, Take 1 tablet (250 mg total) by mouth daily., Disp: 90 tablet, Rfl: 0   valsartan (DIOVAN) 160 MG tablet, TAKE 1 TABLET BY MOUTH EVERY DAY, Disp: 90 tablet, Rfl: 2   Continuous Blood Gluc Transmit (DEXCOM G6 TRANSMITTER) MISC, Use to check blood sugar 3 times daily. Dx code: e11.65, Disp: 1 each, Rfl: 10   No Known Allergies   Review of Systems  Constitutional: Negative.   Eyes:  Negative for blurred vision.  Respiratory: Negative.  Negative for shortness of breath.    Cardiovascular: Negative.  Negative for chest pain and palpitations.  Gastrointestinal: Negative.   Neurological: Negative.   Psychiatric/Behavioral: Negative.       Today's Vitals   09/08/22 1212  BP: 124/82  Pulse: 74  Temp: 98.1 F (36.7 C)  SpO2: 98%  Weight: 187 lb 6.4 oz (85 kg)  Height: '5\' 5"'$  (1.651 m)   Body mass index is 31.18 kg/m.  Wt Readings from Last 3 Encounters:  09/08/22 187 lb 6.4 oz (85 kg)  07/26/22 190 lb 9.6 oz (86.5 kg)  06/22/22 186 lb 8 oz (84.6 kg)    Objective:  Physical Exam Vitals and nursing note reviewed.  Constitutional:      Appearance: Normal appearance.  HENT:     Head: Normocephalic and atraumatic.     Nose:     Comments: Masked     Mouth/Throat:     Comments: Masked  Eyes:     Extraocular Movements: Extraocular movements intact.  Cardiovascular:     Rate and Rhythm: Normal rate and regular rhythm.     Heart sounds: Normal heart sounds.  Pulmonary:     Effort: Pulmonary effort is normal.     Breath sounds: Normal breath sounds.  Musculoskeletal:     Cervical back: Normal range of motion.  Skin:    General: Skin is warm.  Neurological:     General: No focal deficit present.     Mental Status: She is alert.  Psychiatric:        Mood and Affect: Mood normal.        Behavior: Behavior normal.      Assessment And Plan:     1. Dyslipidemia associated with type 2 diabetes mellitus ([place]) Comments: Chronic, LDL goal < 70. She will c/w atorvastatin, Tresiba, and Ozempic. She did not tolerate Wilder Glade, will consider Jardiance. She will rto in 3 months. - BMP8+eGFR; Future - Hemoglobin A1c; Future  2. Chronic hypertension Comments: Chronic, controlled.  She will c/w valsartan '160mg'$  daily.  She is encouraged to follow a low sodium diet.  3. Class 1 obesity due to excess calories with serious comorbidity and body mass index (BMI) of 31.0 to 31.9 in adult Comments: She is encouraged to aim for at least 150 minutes of  exercise/week, while aiming for BMI<30 to decrease cardiac risk.    Patient was given opportunity to ask questions. Patient verbalized understanding of the plan and was able to repeat key elements of the plan. All questions were answered to their satisfaction.   I, Nichole Greenland, MD, have reviewed all documentation for this visit. The documentation on 09/08/22 for the exam, diagnosis, procedures, and orders are all accurate and complete.   IF YOU HAVE BEEN REFERRED TO A SPECIALIST, IT MAY TAKE 1-2 WEEKS  TO SCHEDULE/PROCESS THE REFERRAL. IF YOU HAVE NOT HEARD FROM US/SPECIALIST IN TWO WEEKS, PLEASE GIVE [place] A CALL AT [phone].   THE PATIENT IS ENCOURAGED TO PRACTICE SOCIAL DISTANCING DUE TO THE COVID-19 PANDEMIC.

## 2022-09-08 NOTE — Patient Instructions (Signed)

## 2022-09-19 ENCOUNTER — Encounter: Payer: Self-pay | Admitting: Internal Medicine

## 2022-09-21 ENCOUNTER — Ambulatory Visit: Payer: BC Managed Care – PPO | Admitting: Internal Medicine

## 2022-09-27 ENCOUNTER — Encounter: Payer: Self-pay | Admitting: Internal Medicine

## 2022-10-03 ENCOUNTER — Other Ambulatory Visit: Payer: BC Managed Care – PPO

## 2022-10-03 DIAGNOSIS — E1169 Type 2 diabetes mellitus with other specified complication: Secondary | ICD-10-CM

## 2022-10-04 ENCOUNTER — Encounter: Payer: Self-pay | Admitting: Internal Medicine

## 2022-10-04 LAB — BMP8+EGFR
BUN/Creatinine Ratio: 12 (ref 12–28)
BUN: 10 mg/dL (ref 8–27)
CO2: 19 mmol/L — ABNORMAL LOW (ref 20–29)
Calcium: 9.1 mg/dL (ref 8.7–10.3)
Chloride: 105 mmol/L (ref 96–106)
Creatinine, Ser: 0.81 mg/dL (ref 0.57–1.00)
Glucose: 89 mg/dL (ref 70–99)
Potassium: 3.9 mmol/L (ref 3.5–5.2)
Sodium: 140 mmol/L (ref 134–144)
eGFR: 82 mL/min/{1.73_m2} (ref >59)

## 2022-10-04 LAB — HEMOGLOBIN A1C
Est. average glucose Bld gHb Est-mCnc: 105 mg/dL
Hgb A1c MFr Bld: 5.3 % (ref 4.8–5.6)

## 2022-10-24 ENCOUNTER — Other Ambulatory Visit: Payer: Self-pay | Admitting: Internal Medicine

## 2022-10-27 ENCOUNTER — Ambulatory Visit: Payer: BC Managed Care – PPO | Admitting: Internal Medicine

## 2022-10-28 ENCOUNTER — Encounter: Payer: Self-pay | Admitting: Internal Medicine

## 2022-10-31 ENCOUNTER — Encounter: Payer: Self-pay | Admitting: Internal Medicine

## 2022-10-31 ENCOUNTER — Other Ambulatory Visit: Payer: Self-pay

## 2022-10-31 MED ORDER — TRIAMCINOLONE ACETONIDE 0.1 % EX CREA: TOPICAL_CREAM | CUTANEOUS | 1 refills | Status: AC

## 2022-11-07 ENCOUNTER — Encounter: Payer: Self-pay | Admitting: Internal Medicine

## 2022-11-22 ENCOUNTER — Ambulatory Visit (INDEPENDENT_AMBULATORY_CARE_PROVIDER_SITE_OTHER): Payer: Self-pay | Admitting: Plastic Surgery

## 2022-11-22 DIAGNOSIS — D1801 Hemangioma of skin and subcutaneous tissue: Secondary | ICD-10-CM

## 2022-11-22 NOTE — Progress Notes (Signed)
Preoperative Dx: angioma of lower lip  Postoperative Dx:  same  Procedure: laser to lower lip   Anesthesia: none  Description of Procedure:  Risks and complications were explained to the patient. Consent was confirmed and signed. Eye protection was placed. Time out was called and all information was confirmed to be correct. The area  area was prepped with alcohol and wiped dry. The BBL laser was set at 560 nm and 15 J/cm2. The lower lip was lasered. The patient tolerated the procedure well and there were no complications. The patient is to follow up in 4 weeks.

## 2022-12-05 ENCOUNTER — Encounter: Payer: Self-pay | Admitting: Podiatry

## 2023-01-04 ENCOUNTER — Ambulatory Visit: Payer: BC Managed Care – PPO | Admitting: Podiatry

## 2023-01-04 ENCOUNTER — Other Ambulatory Visit: Payer: Self-pay | Admitting: Podiatry

## 2023-01-04 DIAGNOSIS — B351 Tinea unguium: Secondary | ICD-10-CM

## 2023-01-04 DIAGNOSIS — Z79899 Other long term (current) drug therapy: Secondary | ICD-10-CM | POA: Diagnosis not present

## 2023-01-04 NOTE — Progress Notes (Signed)
Subjective:  Patient ID: Nichole George, female    DOB: Dec 12, 1960,  MRN: [id number]  Chief Complaint  Patient presents with   Nail Problem    62 y.o. female presents with the above complaint.  Patient presents with right hallux nail fungus.  She states is doing better the medication helped some.  She would like to continue denies any other acute complaints   Review of Systems: Negative except as noted in the HPI. Denies N/V/F/Ch.  Past Medical History:  Diagnosis Date   Arthritis    left ankle and right big toe and both knees   Cervical dysplasia, moderate    Hyperlipidemia    Hypertension    Type 2 diabetes mellitus (HCC)    diet  and exercise controlled    Current Outpatient Medications:    Ascorbic Acid (VITAMIN C) POWD, Take by mouth. 1/2 tsp daily , Disp: , Rfl:    aspirin 81 MG chewable tablet, Monday and Wednesday and Friday, Disp: , Rfl:    atorvastatin (LIPITOR) 40 MG tablet, TAKE 1 TABLET DAILY MONDAY THROUGH SATURDAY, SKIP SUNDAYS, Disp: 90 tablet, Rfl: 1   Blood Glucose Monitoring Suppl KIT, Use to check blood sugar 3 times daily. Dx code: e11.65 Dispense based on plan coverage with supplies., Disp: 1 kit, Rfl: 10   Cholecalciferol (VITAMIN D3) 50 MCG (2000 UT) capsule, Take 5,000 Units by mouth daily. , Disp: , Rfl:    Coenzyme Q10 10 MG capsule, , Disp: , Rfl:    Continuous Blood Gluc Receiver (DEXCOM G6 RECEIVER) DEVI, USE TO CHECK BLOOD SUGAR 3 TIMES DAILY. DX CODE: E11.65, Disp: 1 each, Rfl: 0   Continuous Blood Gluc Transmit (DEXCOM G6 TRANSMITTER) MISC, Use to check blood sugar 3 times daily. Dx code: e11.65, Disp: 1 each, Rfl: 10   fluticasone (FLONASE) 50 MCG/ACT nasal spray, SPRAY ONE SPRAY INTO EACH NOSTRIL DAILY, Disp: 48 mL, Rfl: 1   insulin degludec (TRESIBA FLEXTOUCH) 100 UNIT/ML FlexTouch Pen, Inject 6 Units into the skin at bedtime. Max dose 60 units (Patient not taking: Reported on 11/22/2022), Disp: 3 mL, Rfl: 1   Insulin Pen Needle (PEN NEEDLES) 32G  X 4 MM MISC, Use as directed with insulin, Disp: 100 each, Rfl: 2   loratadine (CLARITIN) 10 MG tablet, Take 1 tablet (10 mg total) by mouth daily., Disp: 90 tablet, Rfl: 1   MAGNESIUM PO, , Disp: , Rfl:    Semaglutide, 1 MG/DOSE, (OZEMPIC, 1 MG/DOSE,) 4 MG/3ML SOPN, Inject 1 mg into the skin once a week., Disp: , Rfl:    terbinafine (LAMISIL) 250 MG tablet, Take 1 tablet (250 mg total) by mouth daily., Disp: 90 tablet, Rfl: 0   terbinafine (LAMISIL) 250 MG tablet, Take 1 tablet (250 mg total) by mouth daily., Disp: 90 tablet, Rfl: 0   terbinafine (LAMISIL) 250 MG tablet, Take 1 tablet (250 mg total) by mouth daily., Disp: 90 tablet, Rfl: 0   triamcinolone cream (KENALOG) 0.1 %, APPLY DAILY TO SKIN TO AFFECTED AREA EVERY DAY FOR 30 DAYS 14, Disp: 30 g, Rfl: 1   valsartan (DIOVAN) 160 MG tablet, TAKE 1 TABLET BY MOUTH EVERY DAY, Disp: 90 tablet, Rfl: 2  Social History   Tobacco Use  Smoking Status Never  Smokeless Tobacco Never    No Known Allergies Objective:  There were no vitals filed for this visit. There is no height or weight on file to calculate BMI. Constitutional Well developed. Well nourished.  Vascular Dorsalis pedis pulses palpable bilaterally. Posterior  tibial pulses palpable bilaterally. Capillary refill normal to all digits.  No cyanosis or clubbing noted. Pedal hair growth normal.  Neurologic Normal speech. Oriented to person, place, and time. Epicritic sensation to light touch grossly present bilaterally.  Dermatologic Painful ingrowing nail at medial nail borders of the hallux nail left. No other open wounds. No skin lesions.  Orthopedic: Normal joint ROM without pain or crepitus bilaterally. No visible deformities. No bony tenderness.   Radiographs: None Assessment:   1. Onychomycosis due to dermatophyte   2. High risk medication use   3. Nail fungus    Plan:  Patient was evaluated and treated and all questions answered.  Right hallux  onychomycosis~second round -Educated the patient on the etiology of onychomycosis and various treatment options associated with improving the fungal load.  I explained to the patient that there is 3 treatment options available to treat the onychomycosis including topical, p.o., laser treatment.  Patient elected to undergo p.o. options with Lamisil/terbinafine therapy.  In order for me to start the medication therapy, I explained to the patient the importance of evaluating the liver and obtaining the liver function test.  Once the liver function test comes back normal I will start him on 85-month course of Lamisil therapy.  Patient understood all risk and would like to proceed with Lamisil therapy.  I have asked the patient to immediately stop the Lamisil therapy if she has any reactions to it and call the office or go to the emergency room right away.  Patient states understanding   Ingrown Nail, left -Niccoli healed and doing much better.

## 2023-01-05 LAB — HEPATIC FUNCTION PANEL (6)
ALT: 26 IU/L (ref 0–32)
AST: 28 IU/L (ref 0–40)
Albumin: 4.6 g/dL (ref 3.9–4.9)
Alkaline Phosphatase: 120 IU/L (ref 44–121)
Bilirubin Total: 0.5 mg/dL (ref 0.0–1.2)
Bilirubin, Direct: 0.18 mg/dL (ref 0.00–0.40)

## 2023-01-09 ENCOUNTER — Other Ambulatory Visit: Payer: Self-pay | Admitting: Podiatry

## 2023-01-09 MED ORDER — TERBINAFINE HCL 250 MG PO TABS: 250.0000 mg | ORAL_TABLET | Freq: Every day | ORAL | 0 refills | Status: DC

## 2023-01-09 NOTE — Addendum Note (Signed)
Addended by: Nicholes Rough on: 01/09/2023 08:17 AM   Modules accepted: Orders

## 2023-01-10 ENCOUNTER — Encounter: Payer: Self-pay | Admitting: Podiatry

## 2023-01-13 ENCOUNTER — Ambulatory Visit (INDEPENDENT_AMBULATORY_CARE_PROVIDER_SITE_OTHER): Payer: Self-pay | Admitting: Plastic Surgery

## 2023-01-13 DIAGNOSIS — D1801 Hemangioma of skin and subcutaneous tissue: Secondary | ICD-10-CM

## 2023-01-13 NOTE — Progress Notes (Signed)
Preoperative Dx: Angioma of the lower lip on the right side  Postoperative Dx:  same  Procedure: laser to lip  Anesthesia: none  Description of Procedure:  Risks and complications were explained to the patient. Consent was confirmed and signed. Eye protection was placed. Time out was called and all information was confirmed to be correct. The area  area was prepped with alcohol and wiped dry. The BBL laser was set at 560 nm at 17 J/cm2. The lip was lasered. The patient tolerated the procedure well and there were no complications. The patient is to follow up in 4 weeks.

## 2023-01-19 ENCOUNTER — Other Ambulatory Visit: Payer: Self-pay | Admitting: Internal Medicine

## 2023-01-25 ENCOUNTER — Encounter: Payer: Self-pay | Admitting: Internal Medicine

## 2023-01-25 ENCOUNTER — Other Ambulatory Visit: Payer: Self-pay | Admitting: Internal Medicine

## 2023-01-25 DIAGNOSIS — Z1231 Encounter for screening mammogram for malignant neoplasm of breast: Secondary | ICD-10-CM

## 2023-02-07 ENCOUNTER — Encounter: Payer: Self-pay | Admitting: Internal Medicine

## 2023-02-07 ENCOUNTER — Ambulatory Visit (INDEPENDENT_AMBULATORY_CARE_PROVIDER_SITE_OTHER): Payer: BC Managed Care – PPO | Admitting: Internal Medicine

## 2023-02-07 VITALS — BP 110/78 | HR 65 | Temp 97.9°F | Ht 65.0 in | Wt 192.6 lb

## 2023-02-07 DIAGNOSIS — I10 Essential (primary) hypertension: Secondary | ICD-10-CM | POA: Diagnosis not present

## 2023-02-07 DIAGNOSIS — E1169 Type 2 diabetes mellitus with other specified complication: Secondary | ICD-10-CM

## 2023-02-07 DIAGNOSIS — R42 Dizziness and giddiness: Secondary | ICD-10-CM

## 2023-02-07 DIAGNOSIS — K5909 Other constipation: Secondary | ICD-10-CM

## 2023-02-07 DIAGNOSIS — E785 Hyperlipidemia, unspecified: Secondary | ICD-10-CM

## 2023-02-07 DIAGNOSIS — Z0000 Encounter for general adult medical examination without abnormal findings: Secondary | ICD-10-CM

## 2023-02-07 DIAGNOSIS — Z6832 Body mass index (BMI) 32.0-32.9, adult: Secondary | ICD-10-CM

## 2023-02-07 DIAGNOSIS — E6609 Other obesity due to excess calories: Secondary | ICD-10-CM

## 2023-02-07 LAB — POCT URINALYSIS DIPSTICK
Bilirubin, UA: NEGATIVE
Glucose, UA: NEGATIVE
Ketones, UA: NEGATIVE
Nitrite, UA: NEGATIVE
Protein, UA: POSITIVE — AB
Spec Grav, UA: 1.025 (ref 1.010–1.025)
Urobilinogen, UA: 0.2 E.U./dL
pH, UA: 6 (ref 5.0–8.0)

## 2023-02-07 NOTE — Patient Instructions (Signed)

## 2023-02-07 NOTE — Progress Notes (Unsigned)
I,Victoria T Deloria Lair, CMA,acting as a Neurosurgeon for Gwynneth Aliment, MD.,have documented all relevant documentation on the behalf of Gwynneth Aliment, MD,as directed by  Gwynneth Aliment, MD while in the presence of Gwynneth Aliment, MD.  Subjective:  Patient ID: Nichole George , female    DOB: 05-29-1961 , 62 y.o.   MRN: [id number]  Chief Complaint  Patient presents with   Annual Exam   Diabetes   Hypertension    HPI  Patient presents today for HM.  She is followed by Dr. Su Hilt for her GYN exams. Patient reports compliance with medications. Today, she has been experiencing nausea & dizziness. Her symptoms are similar to vertigo that she has experienced in the past. Sx started about two days ago.  She denies recent cold; however, does admit to recent nasal congestion.  Orthostatics performed.   She also still has a itchy rash on her back. She reports using Cortisone cream.      Diabetes She presents for her initial diabetic visit. She has type 2 diabetes mellitus. The initial diagnosis of diabetes was made 4 weeks ago. Hypoglycemia symptoms include dizziness. Pertinent negatives for diabetes include no blurred vision and no chest pain. There are no hypoglycemic complications. She participates in exercise daily. An ACE inhibitor/angiotensin II receptor blocker is being taken. Eye exam is current.  Hypertension This is a chronic problem. The current episode started more than 1 year ago. The problem has been gradually improving since onset. The problem is controlled. Pertinent negatives include no blurred vision, chest pain or palpitations. Risk factors for coronary artery disease include diabetes mellitus, dyslipidemia and post-menopausal state. Past treatments include angiotensin blockers. The current treatment provides moderate improvement.     Past Medical History:  Diagnosis Date   Arthritis    left ankle and right big toe and both knees   Cervical dysplasia, moderate    Hyperlipidemia     Hypertension    Type 2 diabetes mellitus (HCC)    diet  and exercise controlled     Family History  Problem Relation Age of Onset   Healthy Mother    Pneumonia Father    Breast cancer Sister 33   Cancer Other    Hypertension Other    Hyperlipidemia Other      Current Outpatient Medications:    Ascorbic Acid (VITAMIN C) POWD, Take by mouth. 1/2 tsp daily , Disp: , Rfl:    aspirin 81 MG chewable tablet, Monday and Wednesday and Friday, Disp: , Rfl:    atorvastatin (LIPITOR) 40 MG tablet, TAKE 1 TABLET DAILY MONDAY THROUGH SATURDAY, SKIP SUNDAYS, Disp: 90 tablet, Rfl: 1   Blood Glucose Monitoring Suppl KIT, Use to check blood sugar 3 times daily. Dx code: e11.65 Dispense based on plan coverage with supplies., Disp: 1 kit, Rfl: 10   Cholecalciferol (VITAMIN D3) 50 MCG (2000 UT) capsule, Take 5,000 Units by mouth daily. , Disp: , Rfl:    Coenzyme Q10 10 MG capsule, , Disp: , Rfl:    Continuous Blood Gluc Receiver (DEXCOM G6 RECEIVER) DEVI, USE TO CHECK BLOOD SUGAR 3 TIMES DAILY. DX CODE: E11.65, Disp: 1 each, Rfl: 0   Continuous Blood Gluc Transmit (DEXCOM G6 TRANSMITTER) MISC, Use to check blood sugar 3 times daily. Dx code: e11.65, Disp: 1 each, Rfl: 10   fluticasone (FLONASE) 50 MCG/ACT nasal spray, SPRAY ONE SPRAY INTO EACH NOSTRIL DAILY, Disp: 48 mL, Rfl: 1   Insulin Pen Needle (PEN NEEDLES) 32G X 4 MM  MISC, Use as directed with insulin, Disp: 100 each, Rfl: 2   loratadine (CLARITIN) 10 MG tablet, Take 1 tablet (10 mg total) by mouth daily., Disp: 90 tablet, Rfl: 1   MAGNESIUM PO, , Disp: , Rfl:    Semaglutide, 1 MG/DOSE, (OZEMPIC, 1 MG/DOSE,) 4 MG/3ML SOPN, INJECT 1MG  INTO THE SKIN ONCE A WEEK, Disp: 3 mL, Rfl: 5   terbinafine (LAMISIL) 250 MG tablet, Take 1 tablet (250 mg total) by mouth daily., Disp: 90 tablet, Rfl: 0   terbinafine (LAMISIL) 250 MG tablet, Take 1 tablet (250 mg total) by mouth daily., Disp: 90 tablet, Rfl: 0   terbinafine (LAMISIL) 250 MG tablet, Take 1 tablet  (250 mg total) by mouth daily., Disp: 90 tablet, Rfl: 0   triamcinolone cream (KENALOG) 0.1 %, APPLY DAILY TO SKIN TO AFFECTED AREA EVERY DAY FOR 30 DAYS 14, Disp: 30 g, Rfl: 1   valsartan (DIOVAN) 160 MG tablet, TAKE 1 TABLET BY MOUTH EVERY DAY, Disp: 90 tablet, Rfl: 2   insulin degludec (TRESIBA FLEXTOUCH) 100 UNIT/ML FlexTouch Pen, Inject 6 Units into the skin at bedtime. Max dose 60 units (Patient not taking: Reported on 11/22/2022), Disp: 3 mL, Rfl: 1   No Known Allergies     Review of Systems  Constitutional: Negative.   HENT: Negative.    Eyes: Negative.  Negative for blurred vision.  Respiratory: Negative.    Cardiovascular: Negative.  Negative for chest pain and palpitations.  Gastrointestinal:  Positive for constipation and nausea.       She reports having bowel problems. She admits sometimes her stool gets stuck which makes it hard to come out.   Endocrine: Negative.   Genitourinary: Negative.   Musculoskeletal: Negative.   Skin: Negative.   Allergic/Immunologic: Negative.   Neurological:  Positive for dizziness. Syncope: el4. Hematological: Negative.   Psychiatric/Behavioral: Negative.       Today's Vitals   02/07/23 0843  BP: 110/78  Pulse: 65  Temp: 97.9 F (36.6 C)  SpO2: 98%  Weight: 192 lb 9.6 oz (87.4 kg)  Height: 5\' 5"  (1.651 m)   Body mass index is 32.05 kg/m.  Wt Readings from Last 3 Encounters:  02/07/23 192 lb 9.6 oz (87.4 kg)  09/08/22 187 lb 6.4 oz (85 kg)  07/26/22 190 lb 9.6 oz (86.5 kg)     Objective:  Physical Exam Vitals and nursing note reviewed.  Constitutional:      Appearance: Normal appearance.  HENT:     Head: Normocephalic and atraumatic.     Right Ear: Tympanic membrane, ear canal and external ear normal.     Left Ear: Tympanic membrane, ear canal and external ear normal.     Nose: Nose normal.     Mouth/Throat:     Mouth: Mucous membranes are moist.     Pharynx: Oropharynx is clear.  Eyes:     Extraocular Movements:  Extraocular movements intact.     Conjunctiva/sclera: Conjunctivae normal.     Pupils: Pupils are equal, round, and reactive to light.  Cardiovascular:     Rate and Rhythm: Normal rate and regular rhythm.     Pulses: Normal pulses.     Heart sounds: Normal heart sounds.  Pulmonary:     Effort: Pulmonary effort is normal.     Breath sounds: Normal breath sounds.  Chest:  Breasts:    Tanner Score is 5.     Right: Normal.     Left: Normal.  Abdominal:     General:  Abdomen is flat. Bowel sounds are normal.     Palpations: Abdomen is soft.  Genitourinary:    Comments: deferred Musculoskeletal:        General: Normal range of motion.     Cervical back: Normal range of motion and neck supple.  Skin:    General: Skin is warm and dry.  Neurological:     General: No focal deficit present.     Mental Status: She is alert and oriented to person, place, and time.  Psychiatric:        Mood and Affect: Mood normal.        Behavior: Behavior normal.         Assessment And Plan:  Routine general medical examination at health care facility Assessment & Plan: A full exam was performed. Importance of monthly self breast exams was discussed with the patient. PATIENT IS ADVISED TO GET 30-45 MINUTES REGULAR EXERCISE NO LESS THAN FOUR TO FIVE DAYS PER WEEK - BOTH WEIGHTBEARING EXERCISES AND AEROBIC ARE RECOMMENDED.  PATIENT IS ADVISED TO FOLLOW A HEALTHY DIET WITH AT LEAST SIX FRUITS/VEGGIES PER DAY, DECREASE INTAKE OF RED MEAT, AND TO INCREASE FISH INTAKE TO TWO DAYS PER WEEK.  MEATS/FISH SHOULD NOT BE FRIED, BAKED OR BROILED IS PREFERABLE.  IT IS ALSO IMPORTANT TO CUT BACK ON YOUR SUGAR INTAKE. PLEASE AVOID ANYTHING WITH ADDED SUGAR, CORN SYRUP OR OTHER SWEETENERS. IF YOU MUST USE A SWEETENER, YOU CAN TRY STEVIA. IT IS ALSO IMPORTANT TO AVOID ARTIFICIALLY SWEETENERS AND DIET BEVERAGES. LASTLY, I SUGGEST WEARING SPF 50 SUNSCREEN ON EXPOSED PARTS AND ESPECIALLY WHEN IN THE DIRECT SUNLIGHT FOR AN  EXTENDED PERIOD OF TIME.  PLEASE AVOID FAST FOOD RESTAURANTS AND INCREASE YOUR WATER INTAKE.   Orders: -     CBC -     CMP14+EGFR -     Lipid panel -     Hemoglobin A1c  Dyslipidemia associated with type 2 diabetes mellitus (HCC) Assessment & Plan: Chronic, diabetic foot exam was performed.  She will c/w atorvastatin 40mg  and semaglutide 1mg  weekly. I DISCUSSED WITH THE PATIENT AT LENGTH REGARDING THE GOALS OF GLYCEMIC CONTROL AND POSSIBLE LONG-TERM COMPLICATIONS.  I  ALSO STRESSED THE IMPORTANCE OF COMPLIANCE WITH HOME GLUCOSE MONITORING, DIETARY RESTRICTIONS INCLUDING AVOIDANCE OF SUGARY DRINKS/PROCESSED FOODS,  ALONG WITH REGULAR EXERCISE.  I  ALSO STRESSED THE IMPORTANCE OF ANNUAL EYE EXAMS, SELF FOOT CARE AND COMPLIANCE WITH OFFICE VISITS.   Orders: -     POCT urinalysis dipstick -     Microalbumin / creatinine urine ratio -     EKG 12-Lead  Chronic hypertension Assessment & Plan: Chronic, well controlled.  EKG performed, NSR w/o acute changes. She will continue with valsartan 160mg  daily. She is reminded to follow a low sodium diet. She will f/u in four to six months.   Orders: -     POCT urinalysis dipstick -     Microalbumin / creatinine urine ratio -     EKG 12-Lead  Dizziness Assessment & Plan: Sx are suggestive of vertigo. She may benefit from antihistamine therapy, I.e. Zyrtec. If persistent, will consider meclizine prn.    Chronic constipation Assessment & Plan: Chronic.  She went to GI for further evaluation; however, she was sent away because she would not allow them to keep her cc on file. I wlil start her onLinzess 72mg  daily. She is advised to let me know how this is working for her. She will likely need 145mg  dose for best efficacy.  Class 1 obesity due to excess calories with serious comorbidity and body mass index (BMI) of 32.0 to 32.9 in adult Assessment & Plan: She is encouraged to strive for BMI less than 30 to decrease cardiac risk. Advised to aim  for at least 150 minutes of exercise per week.    She is encouraged to strive for BMI less than 30 to decrease cardiac risk. Advised to aim for at least 150 minutes of exercise per week.    Return for 1 year physical, 4 month DM.  Patient was given opportunity to ask questions. Patient verbalized understanding of the plan and was able to repeat key elements of the plan. All questions were answered to their satisfaction.   I, Gwynneth Aliment, MD, have reviewed all documentation for this visit. The documentation on 02/07/23 for the exam, diagnosis, procedures, and orders are all accurate and complete.   IF YOU HAVE BEEN REFERRED TO A SPECIALIST, IT MAY TAKE 1-2 WEEKS TO SCHEDULE/PROCESS THE REFERRAL. IF YOU HAVE NOT HEARD FROM US/SPECIALIST IN TWO WEEKS, PLEASE GIVE [place] A CALL AT [phone] X [phone].   THE PATIENT IS ENCOURAGED TO PRACTICE SOCIAL DISTANCING DUE TO THE COVID-19 PANDEMIC.

## 2023-02-13 DIAGNOSIS — R42 Dizziness and giddiness: Secondary | ICD-10-CM | POA: Insufficient documentation

## 2023-02-13 DIAGNOSIS — K5909 Other constipation: Secondary | ICD-10-CM | POA: Insufficient documentation

## 2023-02-13 DIAGNOSIS — E66811 Obesity, class 1: Secondary | ICD-10-CM | POA: Insufficient documentation

## 2023-02-13 DIAGNOSIS — E6609 Other obesity due to excess calories: Secondary | ICD-10-CM | POA: Insufficient documentation

## 2023-02-13 DIAGNOSIS — Z0000 Encounter for general adult medical examination without abnormal findings: Secondary | ICD-10-CM | POA: Insufficient documentation

## 2023-02-13 NOTE — Assessment & Plan Note (Signed)
Chronic, diabetic foot exam was performed.  She will c/w atorvastatin 40mg  and semaglutide 1mg  weekly. I DISCUSSED WITH THE PATIENT AT LENGTH REGARDING THE GOALS OF GLYCEMIC CONTROL AND POSSIBLE LONG-TERM COMPLICATIONS.  I  ALSO STRESSED THE IMPORTANCE OF COMPLIANCE WITH HOME GLUCOSE MONITORING, DIETARY RESTRICTIONS INCLUDING AVOIDANCE OF SUGARY DRINKS/PROCESSED FOODS,  ALONG WITH REGULAR EXERCISE.  I  ALSO STRESSED THE IMPORTANCE OF ANNUAL EYE EXAMS, SELF FOOT CARE AND COMPLIANCE WITH OFFICE VISITS.

## 2023-02-13 NOTE — Assessment & Plan Note (Signed)
She is encouraged to strive for BMI less than 30 to decrease cardiac risk. Advised to aim for at least 150 minutes of exercise per week.  

## 2023-02-13 NOTE — Assessment & Plan Note (Signed)
Sx are suggestive of vertigo. She may benefit from antihistamine therapy, I.e. Zyrtec. If persistent, will consider meclizine prn.

## 2023-02-13 NOTE — Assessment & Plan Note (Signed)

## 2023-02-13 NOTE — Assessment & Plan Note (Signed)
Chronic, well controlled.  EKG performed, NSR w/o acute changes. She will continue with valsartan 160mg  daily. She is reminded to follow a low sodium diet. She will f/u in four to six months.

## 2023-02-13 NOTE — Assessment & Plan Note (Signed)
Chronic.  She went to GI for further evaluation; however, she was sent away because she would not allow them to keep her cc on file. I wlil start her onLinzess 72mg  daily. She is advised to let me know how this is working for her. She will likely need 145mg  dose for best efficacy.

## 2023-02-14 ENCOUNTER — Encounter: Payer: Self-pay | Admitting: Internal Medicine

## 2023-02-23 ENCOUNTER — Encounter: Payer: Self-pay | Admitting: Internal Medicine

## 2023-02-24 ENCOUNTER — Other Ambulatory Visit: Payer: BC Managed Care – PPO | Admitting: Plastic Surgery

## 2023-03-15 ENCOUNTER — Encounter: Payer: Self-pay | Admitting: Internal Medicine

## 2023-03-16 ENCOUNTER — Ambulatory Visit
Admission: RE | Admit: 2023-03-16 | Discharge: 2023-03-16 | Disposition: A | Discharge status: Home / Self Care | Payer: BC Managed Care – PPO | Source: Ambulatory Visit | Attending: Internal Medicine | Admitting: Internal Medicine

## 2023-03-16 DIAGNOSIS — Z1231 Encounter for screening mammogram for malignant neoplasm of breast: Secondary | ICD-10-CM

## 2023-03-17 ENCOUNTER — Other Ambulatory Visit: Payer: Self-pay | Admitting: Obstetrics and Gynecology

## 2023-03-17 DIAGNOSIS — M8580 Other specified disorders of bone density and structure, unspecified site: Secondary | ICD-10-CM

## 2023-03-31 ENCOUNTER — Encounter: Payer: Self-pay | Admitting: Internal Medicine

## 2023-03-31 ENCOUNTER — Other Ambulatory Visit: Payer: Self-pay | Admitting: Internal Medicine

## 2023-03-31 DIAGNOSIS — Z86010 Personal history of colonic polyps: Secondary | ICD-10-CM

## 2023-04-05 ENCOUNTER — Encounter: Payer: Self-pay | Admitting: Internal Medicine

## 2023-04-12 ENCOUNTER — Telehealth: Payer: Self-pay | Admitting: Gastroenterology

## 2023-04-12 NOTE — Telephone Encounter (Signed)
Good morning Dr. Lavon Paganini,   We received a referral for this patient to be scheduled for a colonoscopy. She last has a colonoscopy 5 years ago with Dr. Loreta Ave at [facility] but states she was not satisfied with the care received there. She would like to establish GI care with [facility] and primarily you. Records were obtained and scanned into [employer] for your review. Please advise on scheduling.   Thank you.

## 2023-04-13 NOTE — Telephone Encounter (Signed)
Records on Dr Jillyn Hidden desk for review

## 2023-04-18 NOTE — Telephone Encounter (Signed)
Dr Lavon Paganini has reviewed records and will accept the patient as a new patient.  OK to schedule.

## 2023-04-21 ENCOUNTER — Encounter: Payer: Self-pay | Admitting: Gastroenterology

## 2023-04-21 NOTE — Telephone Encounter (Signed)
Left voicemail for patient to call back and discuss colonoscopy.

## 2023-04-26 ENCOUNTER — Encounter: Payer: Self-pay | Admitting: Internal Medicine

## 2023-04-28 ENCOUNTER — Ambulatory Visit (INDEPENDENT_AMBULATORY_CARE_PROVIDER_SITE_OTHER): Payer: BC Managed Care – PPO

## 2023-04-28 ENCOUNTER — Other Ambulatory Visit: Payer: BC Managed Care – PPO | Admitting: Plastic Surgery

## 2023-04-28 ENCOUNTER — Ambulatory Visit: Payer: Self-pay

## 2023-04-28 VITALS — BP 110/74 | HR 85 | Temp 98.5°F | Ht 65.0 in | Wt 192.0 lb

## 2023-04-28 DIAGNOSIS — Z23 Encounter for immunization: Secondary | ICD-10-CM | POA: Diagnosis not present

## 2023-04-28 NOTE — Progress Notes (Signed)
Patient presents today for a flu shot.  

## 2023-05-02 ENCOUNTER — Other Ambulatory Visit: Payer: BC Managed Care – PPO | Admitting: Plastic Surgery

## 2023-05-04 ENCOUNTER — Other Ambulatory Visit: Payer: Self-pay | Admitting: Internal Medicine

## 2023-05-04 DIAGNOSIS — Z1231 Encounter for screening mammogram for malignant neoplasm of breast: Secondary | ICD-10-CM

## 2023-05-05 ENCOUNTER — Ambulatory Visit: Payer: BC Managed Care – PPO | Admitting: Podiatry

## 2023-05-05 ENCOUNTER — Ambulatory Visit (INDEPENDENT_AMBULATORY_CARE_PROVIDER_SITE_OTHER): Payer: BC Managed Care – PPO

## 2023-05-05 ENCOUNTER — Encounter: Payer: Self-pay | Admitting: Podiatry

## 2023-05-05 DIAGNOSIS — M778 Other enthesopathies, not elsewhere classified: Secondary | ICD-10-CM | POA: Diagnosis not present

## 2023-05-05 DIAGNOSIS — L600 Ingrowing nail: Secondary | ICD-10-CM | POA: Diagnosis not present

## 2023-05-05 NOTE — Progress Notes (Signed)
Subjective:  Patient ID: Nichole George, female    DOB: 1960-09-13,  MRN: [id number]  Chief Complaint  Patient presents with   Toe Pain    Right toe pain patient states it feel like something is under the nail    62 y.o. female presents with the above complaint.  Patient presents with right hallux lateral border ingrown painful to touch is progressive and worse worse with ambulation worse with pressure patient would like to have removed.  Pain scale 7 out of 10 dull aching nature.  Pain scale 7 out of 10 dull aching nature   Review of Systems: Negative except as noted in the HPI. Denies N/V/F/Ch.  Past Medical History:  Diagnosis Date   Arthritis    left ankle and right big toe and both knees   Cervical dysplasia, moderate    Hyperlipidemia    Hypertension    Type 2 diabetes mellitus (HCC)    diet  and exercise controlled    Current Outpatient Medications:    Ascorbic Acid (VITAMIN C) POWD, Take by mouth. 1/2 tsp daily , Disp: , Rfl:    aspirin 81 MG chewable tablet, Monday and Wednesday and Friday, Disp: , Rfl:    atorvastatin (LIPITOR) 40 MG tablet, TAKE 1 TABLET DAILY MONDAY THROUGH SATURDAY, SKIP SUNDAYS, Disp: 90 tablet, Rfl: 1   Blood Glucose Monitoring Suppl KIT, Use to check blood sugar 3 times daily. Dx code: e11.65 Dispense based on plan coverage with supplies., Disp: 1 kit, Rfl: 10   Cholecalciferol (VITAMIN D3) 50 MCG (2000 UT) capsule, Take 5,000 Units by mouth daily. , Disp: , Rfl:    Coenzyme Q10 10 MG capsule, , Disp: , Rfl:    Continuous Blood Gluc Receiver (DEXCOM G6 RECEIVER) DEVI, USE TO CHECK BLOOD SUGAR 3 TIMES DAILY. DX CODE: E11.65, Disp: 1 each, Rfl: 0   Continuous Blood Gluc Transmit (DEXCOM G6 TRANSMITTER) MISC, Use to check blood sugar 3 times daily. Dx code: e11.65, Disp: 1 each, Rfl: 10   fluticasone (FLONASE) 50 MCG/ACT nasal spray, SPRAY ONE SPRAY INTO EACH NOSTRIL DAILY, Disp: 48 mL, Rfl: 1   insulin degludec (TRESIBA FLEXTOUCH) 100 UNIT/ML  FlexTouch Pen, Inject 6 Units into the skin at bedtime. Max dose 60 units (Patient not taking: Reported on 11/22/2022), Disp: 3 mL, Rfl: 1   Insulin Pen Needle (PEN NEEDLES) 32G X 4 MM MISC, Use as directed with insulin, Disp: 100 each, Rfl: 2   loratadine (CLARITIN) 10 MG tablet, Take 1 tablet (10 mg total) by mouth daily., Disp: 90 tablet, Rfl: 1   MAGNESIUM PO, , Disp: , Rfl:    Semaglutide, 1 MG/DOSE, (OZEMPIC, 1 MG/DOSE,) 4 MG/3ML SOPN, INJECT 1MG  INTO THE SKIN ONCE A WEEK, Disp: 3 mL, Rfl: 5   terbinafine (LAMISIL) 250 MG tablet, Take 1 tablet (250 mg total) by mouth daily., Disp: 90 tablet, Rfl: 0   terbinafine (LAMISIL) 250 MG tablet, Take 1 tablet (250 mg total) by mouth daily., Disp: 90 tablet, Rfl: 0   terbinafine (LAMISIL) 250 MG tablet, Take 1 tablet (250 mg total) by mouth daily., Disp: 90 tablet, Rfl: 0   triamcinolone cream (KENALOG) 0.1 %, APPLY DAILY TO SKIN TO AFFECTED AREA EVERY DAY FOR 30 DAYS 14, Disp: 30 g, Rfl: 1   valsartan (DIOVAN) 160 MG tablet, TAKE 1 TABLET BY MOUTH EVERY DAY, Disp: 90 tablet, Rfl: 2  Social History   Tobacco Use  Smoking Status Never  Smokeless Tobacco Never    No Known  Allergies Objective:  There were no vitals filed for this visit. There is no height or weight on file to calculate BMI. Constitutional Well developed. Well nourished.  Vascular Dorsalis pedis pulses palpable bilaterally. Posterior tibial pulses palpable bilaterally. Capillary refill normal to all digits.  No cyanosis or clubbing noted. Pedal hair growth normal.  Neurologic Normal speech. Oriented to person, place, and time. Epicritic sensation to light touch grossly present bilaterally.  Dermatologic Painful ingrowing nail at lateral nail borders of the hallux nail right. No other open wounds. No skin lesions.  Orthopedic: Normal joint ROM without pain or crepitus bilaterally. No visible deformities. No bony tenderness.   Radiographs: None Assessment:   1.  Capsulitis of foot    Plan:  Patient was evaluated and treated and all questions answered.  Ingrown Nail, right -Patient elects to proceed with minor surgery to remove ingrown toenail removal today. Consent reviewed and signed by patient. -Ingrown nail excised. See procedure note. -Educated on post-procedure care including soaking. Written instructions provided and reviewed. -Patient to follow up in 2 weeks for nail check.  Procedure: Excision of Ingrown Toenail Location: Right 1st toe lateral nail borders. Anesthesia: Lidocaine 1% plain; 1.5 mL and Marcaine 0.5% plain; 1.5 mL, digital block. Skin Prep: Betadine. Dressing: Silvadene; telfa; dry, sterile, compression dressing. Technique: Following skin prep, the toe was exsanguinated and a tourniquet was secured at the base of the toe. The affected nail border was freed, split with a nail splitter, and excised. Chemical matrixectomy was then performed with phenol and irrigated out with alcohol. The tourniquet was then removed and sterile dressing applied. Disposition: Patient tolerated procedure well. Patient to return in 2 weeks for follow-up.   No follow-ups on file.

## 2023-05-05 NOTE — Patient Instructions (Signed)

## 2023-05-12 ENCOUNTER — Ambulatory Visit (INDEPENDENT_AMBULATORY_CARE_PROVIDER_SITE_OTHER): Payer: BC Managed Care – PPO

## 2023-05-12 VITALS — BP 120/80 | HR 83 | Temp 98.6°F | Ht 65.0 in | Wt 192.0 lb

## 2023-05-12 DIAGNOSIS — Z23 Encounter for immunization: Secondary | ICD-10-CM | POA: Diagnosis not present

## 2023-05-12 NOTE — Progress Notes (Signed)
Patient presents today for covid shot. Patient waited allotted time after receiving covid shot.

## 2023-05-17 ENCOUNTER — Encounter: Payer: Self-pay | Admitting: Internal Medicine

## 2023-05-23 ENCOUNTER — Encounter: Payer: Self-pay | Admitting: Plastic Surgery

## 2023-05-23 ENCOUNTER — Ambulatory Visit (INDEPENDENT_AMBULATORY_CARE_PROVIDER_SITE_OTHER): Payer: Self-pay | Admitting: Plastic Surgery

## 2023-05-23 DIAGNOSIS — D1801 Hemangioma of skin and subcutaneous tissue: Secondary | ICD-10-CM

## 2023-05-23 NOTE — Progress Notes (Signed)
Preoperative Dx: angioma of lower lip  Postoperative Dx:  same  Procedure: laser to lower lip angioma   Anesthesia: none  Description of Procedure:  Risks and complications were explained to the patient. Consent was confirmed and signed. Eye protection was placed. Time out was called and all information was confirmed to be correct. The area  area was prepped with alcohol and wiped dry. The Heroic laser was set at 590 nm (15) presetting J/cm2. The lip was lasered. The patient tolerated the procedure well and there were no complications. The patient is to follow up in 4 weeks.

## 2023-05-26 ENCOUNTER — Encounter: Payer: Self-pay | Admitting: Internal Medicine

## 2023-06-19 ENCOUNTER — Ambulatory Visit: Payer: BC Managed Care – PPO

## 2023-06-19 VITALS — Ht 65.0 in | Wt 198.0 lb

## 2023-06-19 DIAGNOSIS — Z800 Family history of malignant neoplasm of digestive organs: Secondary | ICD-10-CM

## 2023-06-19 MED ORDER — NA SULFATE-K SULFATE-MG SULF 17.5-3.13-1.6 GM/177ML PO SOLN: 1.0000 | Freq: Once | ORAL | 0 refills | Status: AC

## 2023-06-19 NOTE — Progress Notes (Signed)
Pre visit completed via phone call; Patient verified name, DOB, and address; No egg or soy allergy known to patient;  No issues known to pt with past sedation with any surgeries or procedures; Patient denies ever being told they had issues or difficulty with intubation;  No FH of Malignant Hyperthermia; Pt is not on diet pills; Pt is not on home 02;  Pt is not on blood thinners; Pt denies issues with constipation;  No A fib or A flutter; Have any cardiac testing pending--NO Insurance verified during PV appt--- BCBS State Pt can ambulate without assistance;  Pt denies use of chewing tobacco Discussed diabetic/weight loss medication holds; Discussed NSAID holds; Checked BMI to be less than 50; Pt instructed to use Single[employer]com or GoodRx for a price reduction on prep;  Patient's chart reviewed by Cathlyn Parsons CNRA prior to previsit and patient appropriate for the [facility];  Pre visit completed and red dot placed by patient's name on their procedure day (on provider's schedule);  Instructions sent to MyChart per patient request;

## 2023-06-22 ENCOUNTER — Ambulatory Visit: Payer: BC Managed Care – PPO | Admitting: Internal Medicine

## 2023-06-22 ENCOUNTER — Encounter: Payer: Self-pay | Admitting: Internal Medicine

## 2023-06-22 VITALS — BP 134/90 | HR 85 | Temp 97.5°F | Ht 65.0 in | Wt 202.8 lb

## 2023-06-22 DIAGNOSIS — E785 Hyperlipidemia, unspecified: Secondary | ICD-10-CM

## 2023-06-22 DIAGNOSIS — E66811 Obesity, class 1: Secondary | ICD-10-CM

## 2023-06-22 DIAGNOSIS — E1169 Type 2 diabetes mellitus with other specified complication: Secondary | ICD-10-CM | POA: Diagnosis not present

## 2023-06-22 DIAGNOSIS — E6609 Other obesity due to excess calories: Secondary | ICD-10-CM

## 2023-06-22 DIAGNOSIS — M545 Low back pain, unspecified: Secondary | ICD-10-CM

## 2023-06-22 DIAGNOSIS — K5909 Other constipation: Secondary | ICD-10-CM

## 2023-06-22 DIAGNOSIS — Z6833 Body mass index (BMI) 33.0-33.9, adult: Secondary | ICD-10-CM

## 2023-06-22 DIAGNOSIS — I10 Essential (primary) hypertension: Secondary | ICD-10-CM | POA: Diagnosis not present

## 2023-06-22 NOTE — Progress Notes (Signed)
I,Victoria T Deloria Lair, CMA,acting as a Neurosurgeon for Gwynneth Aliment, MD.,have documented all relevant documentation on the behalf of Gwynneth Aliment, MD,as directed by  Gwynneth Aliment, MD while in the presence of Gwynneth Aliment, MD.  Subjective:  Patient ID: Nichole George , female    DOB: 06/15/1961 , 62 y.o.   MRN: [id number]  Chief Complaint  Patient presents with   Diabetes   Hypertension    HPI  Patient presents today for a DM/BP check, patient reports compliance with medications. Denies headache, chest pain & sob. She reports experiencing lower right back pain. At home she has used cold & warm compresses without relief. She denies having any urinary/bowel incontinence.   She admits not yet scheduling eye exam.   Diabetes She presents for her initial diabetic visit. She has type 2 diabetes mellitus. The initial diagnosis of diabetes was made 4 weeks ago. There are no hypoglycemic associated symptoms. Pertinent negatives for diabetes include no blurred vision and no chest pain. There are no hypoglycemic complications. Risk factors for coronary artery disease include diabetes mellitus, hypertension, post-menopausal and obesity. She is following a diabetic diet. She participates in exercise three times a week. Eye exam is current.  Hypertension This is a chronic problem. The problem has been gradually improving since onset. The problem is uncontrolled. Pertinent negatives include no blurred vision, chest pain, palpitations or shortness of breath. The current treatment provides moderate improvement. Compliance problems include exercise.      Past Medical History:  Diagnosis Date   Arthritis    generalized   Cervical dysplasia, moderate    Hyperlipidemia    on meds   Hypertension    on meds   Osteoporosis    Type 2 diabetes mellitus (HCC)    diet  and exercise controlled     Family History  Problem Relation Age of Onset   Healthy Mother    Pneumonia Father    Breast cancer  Sister 13   Colon cancer Maternal Aunt 23   Cancer Other    Hypertension Other    Hyperlipidemia Other    Colon polyps Neg Hx    Esophageal cancer Neg Hx    Rectal cancer Neg Hx    Stomach cancer Neg Hx      Current Outpatient Medications:    Ascorbic Acid (VITAMIN C) POWD, Take by mouth. 1/2 tsp daily , Disp: , Rfl:    aspirin 81 MG chewable tablet, Monday and Wednesday and Friday, Disp: , Rfl:    atorvastatin (LIPITOR) 40 MG tablet, TAKE 1 TABLET DAILY MONDAY THROUGH SATURDAY, SKIP SUNDAYS, Disp: 90 tablet, Rfl: 1   Blood Glucose Monitoring Suppl KIT, Use to check blood sugar 3 times daily. Dx code: e11.65 Dispense based on plan coverage with supplies., Disp: 1 kit, Rfl: 10   Cholecalciferol (VITAMIN D3) 50 MCG (2000 UT) capsule, Take 5,000 Units by mouth daily. , Disp: , Rfl:    Coenzyme Q10 10 MG capsule, , Disp: , Rfl:    fluticasone (FLONASE) 50 MCG/ACT nasal spray, SPRAY ONE SPRAY INTO EACH NOSTRIL DAILY (Patient taking differently: Place 2 sprays into both nostrils daily as needed for allergies. SPRAY ONE SPRAY INTO EACH NOSTRIL DAILY), Disp: 48 mL, Rfl: 1   Insulin Pen Needle (PEN NEEDLES) 32G X 4 MM MISC, Use as directed with insulin, Disp: 100 each, Rfl: 2   loratadine (CLARITIN) 10 MG tablet, Take 1 tablet (10 mg total) by mouth daily., Disp: 90 tablet, Rfl: 1  MAGNESIUM PO, , Disp: , Rfl:    PSYLLIUM HUSK PO, Take 1 Dose by mouth daily at 6 (six) AM., Disp: , Rfl:    Semaglutide, 1 MG/DOSE, (OZEMPIC, 1 MG/DOSE,) 4 MG/3ML SOPN, INJECT 1MG  INTO THE SKIN ONCE A WEEK, Disp: 3 mL, Rfl: 5   triamcinolone cream (KENALOG) 0.1 %, APPLY DAILY TO SKIN TO AFFECTED AREA EVERY DAY FOR 30 DAYS 14, Disp: 30 g, Rfl: 1   valsartan (DIOVAN) 160 MG tablet, TAKE 1 TABLET BY MOUTH EVERY DAY, Disp: 90 tablet, Rfl: 2   Continuous Blood Gluc Receiver (DEXCOM G6 RECEIVER) DEVI, USE TO CHECK BLOOD SUGAR 3 TIMES DAILY. DX CODE: E11.65 (Patient not taking: Reported on 06/22/2023), Disp: 1 each, Rfl: 0    Continuous Blood Gluc Transmit (DEXCOM G6 TRANSMITTER) MISC, Use to check blood sugar 3 times daily. Dx code: e11.65 (Patient not taking: Reported on 06/22/2023), Disp: 1 each, Rfl: 10   No Known Allergies   Review of Systems  Constitutional: Negative.   Eyes:  Negative for blurred vision.  Respiratory: Negative.  Negative for shortness of breath.   Cardiovascular: Negative.  Negative for chest pain and palpitations.  Gastrointestinal: Negative.   Musculoskeletal:  Positive for back pain.  Neurological: Negative.      Today's Vitals   06/22/23 1440 06/22/23 1503  BP: (!) 140/92 (!) 134/90  Pulse: 85   Temp: (!) 97.5 F (36.4 C)   SpO2: 98%   Weight: 202 lb 12.8 oz (92 kg)   Height: 5\' 5"  (1.651 m)    Body mass index is 33.75 kg/m.  Wt Readings from Last 3 Encounters:  06/22/23 202 lb 12.8 oz (92 kg)  06/19/23 198 lb (89.8 kg)  05/12/23 192 lb (87.1 kg)     Objective:  Physical Exam Vitals and nursing note reviewed.  Constitutional:      Appearance: Normal appearance.  HENT:     Head: Normocephalic and atraumatic.  Eyes:     Extraocular Movements: Extraocular movements intact.  Cardiovascular:     Rate and Rhythm: Normal rate and regular rhythm.     Heart sounds: Normal heart sounds.  Pulmonary:     Effort: Pulmonary effort is normal.     Breath sounds: Normal breath sounds.  Musculoskeletal:        General: Tenderness present.     Cervical back: Normal range of motion.  Skin:    General: Skin is warm.  Neurological:     General: No focal deficit present.     Mental Status: She is alert.  Psychiatric:        Mood and Affect: Mood normal.        Behavior: Behavior normal.         Assessment And Plan:  Dyslipidemia associated with type 2 diabetes mellitus (HCC) Assessment & Plan: Chronic, she will c/w atorvastatin 40mg  and semaglutide 1mg  weekly. She is encouraged to schedule diabetic eye exam. She will f/u in four months for re-evaluation.   Orders: -      Hemoglobin A1c -     BMP8+EGFR  Chronic hypertension Assessment & Plan: Chronic, well controlled.  She will continue with valsartan 160mg  daily. She is reminded to follow a low sodium diet. She will f/u in four to six months.    Acute right-sided low back pain without sciatica Assessment & Plan: This could be contributing to her BP elevation. She is encouraged to try heat and stretching exercises. May benefit from muscle relaxer if persistent.  Class 1 obesity due to excess calories with serious comorbidity and body mass index (BMI) of 33.0 to 33.9 in adult Assessment & Plan: She has gained 10lbs since November 2024.  She is encouraged to strive for BMI less than 30 to decrease cardiac risk. Advised to aim for at least 150 minutes of exercise per week.    She is encouraged to strive for BMI less than 30 to decrease cardiac risk. Advised to aim for at least 150 minutes of exercise per week.    Return move Feb to April for DM check.  Patient was given opportunity to ask questions. Patient verbalized understanding of the plan and was able to repeat key elements of the plan. All questions were answered to their satisfaction.    I, Gwynneth Aliment, MD, have reviewed all documentation for this visit. The documentation on 06/22/23 for the exam, diagnosis, procedures, and orders are all accurate and complete.   IF YOU HAVE BEEN REFERRED TO A SPECIALIST, IT MAY TAKE 1-2 WEEKS TO SCHEDULE/PROCESS THE REFERRAL. IF YOU HAVE NOT HEARD FROM US/SPECIALIST IN TWO WEEKS, PLEASE GIVE [place] A CALL AT [phone].   THE PATIENT IS ENCOURAGED TO PRACTICE SOCIAL DISTANCING DUE TO THE COVID-19 PANDEMIC.

## 2023-06-22 NOTE — Patient Instructions (Signed)

## 2023-06-23 ENCOUNTER — Encounter: Payer: Self-pay | Admitting: Internal Medicine

## 2023-06-23 ENCOUNTER — Encounter: Payer: Self-pay | Admitting: Gastroenterology

## 2023-06-23 LAB — BMP8+EGFR
BUN/Creatinine Ratio: 13 (ref 12–28)
BUN: 10 mg/dL (ref 8–27)
CO2: 21 mmol/L (ref 20–29)
Calcium: 8.9 mg/dL (ref 8.7–10.3)
Chloride: 103 mmol/L (ref 96–106)
Creatinine, Ser: 0.78 mg/dL (ref 0.57–1.00)
Glucose: 92 mg/dL (ref 70–99)
Potassium: 3.9 mmol/L (ref 3.5–5.2)
Sodium: 141 mmol/L (ref 134–144)
eGFR: 85 mL/min/{1.73_m2} (ref >59)

## 2023-06-23 LAB — HEMOGLOBIN A1C
Est. average glucose Bld gHb Est-mCnc: 128 mg/dL
Hgb A1c MFr Bld: 6.1 % — ABNORMAL HIGH (ref 4.8–5.6)

## 2023-06-26 ENCOUNTER — Encounter: Payer: Self-pay | Admitting: Internal Medicine

## 2023-06-30 ENCOUNTER — Encounter: Payer: Self-pay | Admitting: Internal Medicine

## 2023-06-30 DIAGNOSIS — M545 Low back pain, unspecified: Secondary | ICD-10-CM | POA: Insufficient documentation

## 2023-06-30 NOTE — Assessment & Plan Note (Addendum)
Chronic, she will c/w atorvastatin 40mg  and semaglutide 1mg  weekly. She is encouraged to schedule diabetic eye exam. She will f/u in four months for re-evaluation.

## 2023-06-30 NOTE — Assessment & Plan Note (Signed)
This could be contributing to her BP elevation. She is encouraged to try heat and stretching exercises. May benefit from muscle relaxer if persistent.

## 2023-06-30 NOTE — Assessment & Plan Note (Signed)
Chronic, well controlled.  She will continue with valsartan 160mg  daily. She is reminded to follow a low sodium diet. She will f/u in four to six months.

## 2023-06-30 NOTE — Assessment & Plan Note (Addendum)
She has gained 10lbs since November 2024.  She is encouraged to strive for BMI less than 30 to decrease cardiac risk. Advised to aim for at least 150 minutes of exercise per week.

## 2023-07-03 ENCOUNTER — Encounter: Payer: Self-pay | Admitting: Gastroenterology

## 2023-07-03 ENCOUNTER — Ambulatory Visit (AMBULATORY_SURGERY_CENTER): Payer: BC Managed Care – PPO | Admitting: Gastroenterology

## 2023-07-03 VITALS — BP 119/73 | HR 71 | Temp 97.4°F | Resp 14 | Ht 65.0 in | Wt 198.0 lb

## 2023-07-03 DIAGNOSIS — K648 Other hemorrhoids: Secondary | ICD-10-CM | POA: Diagnosis not present

## 2023-07-03 DIAGNOSIS — K644 Residual hemorrhoidal skin tags: Secondary | ICD-10-CM | POA: Diagnosis not present

## 2023-07-03 DIAGNOSIS — Z1211 Encounter for screening for malignant neoplasm of colon: Secondary | ICD-10-CM

## 2023-07-03 MED ORDER — SODIUM CHLORIDE 0.9 % IV SOLN: 500.0000 mL | Freq: Once | INTRAVENOUS | Status: DC

## 2023-07-03 NOTE — Patient Instructions (Signed)
Discharge instructions given. Handout on Hemorrhoids. Resume previous medications. YOU HAD AN ENDOSCOPIC PROCEDURE TODAY AT THE [facility]:   Refer to the procedure report that was given to you for any specific questions about what was found during the examination.  If the procedure report does not answer your questions, please call your gastroenterologist to clarify.  If you requested that your care partner not be given the details of your procedure findings, then the procedure report has been included in a sealed envelope for you to review at your convenience later.  YOU SHOULD EXPECT: Some feelings of bloating in the abdomen. Passage of more gas than usual.  Walking can help get rid of the air that was put into your GI tract during the procedure and reduce the bloating. If you had a lower endoscopy (such as a colonoscopy or flexible sigmoidoscopy) you may notice spotting of blood in your stool or on the toilet paper. If you underwent a bowel prep for your procedure, you may not have a normal bowel movement for a few days.  Please Note:  You might notice some irritation and congestion in your nose or some drainage.  This is from the oxygen used during your procedure.  There is no need for concern and it should clear up in a day or so.  SYMPTOMS TO REPORT IMMEDIATELY:  Following lower endoscopy (colonoscopy or flexible sigmoidoscopy):  Excessive amounts of blood in the stool  Significant tenderness or worsening of abdominal pains  Swelling of the abdomen that is new, acute  Fever of 100F or higher   For urgent or emergent issues, a gastroenterologist can be reached at any hour by calling [phone]. Do not use MyChart messaging for urgent concerns.    DIET:  We do recommend a small meal at first, but then you may proceed to your regular diet.  Drink plenty of fluids but you should avoid alcoholic beverages for 24 hours.  ACTIVITY:  You should plan to take it easy for the  rest of today and you should NOT DRIVE or use heavy machinery until tomorrow (because of the sedation medicines used during the test).    FOLLOW UP: Our staff will call the number listed on your records the next business day following your procedure.  We will call around 7:15- 8:00 am to check on you and address any questions or concerns that you may have regarding the information given to you following your procedure. If we do not reach you, we will leave a message.     If any biopsies were taken you will be contacted by phone or by letter within the next 1-3 weeks.  Please call us at [phone] if you have not heard about the biopsies in 3 weeks.    SIGNATURES/CONFIDENTIALITY: You and/or your care partner have signed paperwork which will be entered into your electronic medical record.  These signatures attest to the fact that that the information above on your After Visit Summary has been reviewed and is understood.  Full responsibility of the confidentiality of this discharge information lies with you and/or your care-partner. 

## 2023-07-03 NOTE — Progress Notes (Signed)
Pt's states no medical or surgical changes since previsit or office visit. 

## 2023-07-03 NOTE — Progress Notes (Signed)
Pt with small amount of light green emesis- immediately suctioned oral cavity head elevated Dr Lavon Paganini aware, zofran given IV. Lungs clear

## 2023-07-03 NOTE — Progress Notes (Signed)
[facility] History and Physical   Primary Care Physician:  Dorothyann Peng, MD   Reason for Procedure:  Screening for colon cancer  Plan:    Screening colonoscopy with possible interventions as needed     HPI: Nichole George is a very pleasant 62 y.o. female here for screening colonoscopy. Denies any nausea, vomiting, abdominal pain, melena or bright red blood per rectum  The risks and benefits as well as alternatives of endoscopic procedure(s) have been discussed and reviewed. All questions answered. The patient agrees to proceed.    Past Medical History:  Diagnosis Date   Arthritis    generalized   Cervical dysplasia, moderate    Hyperlipidemia    on meds   Hypertension    on meds   Osteoporosis    Type 2 diabetes mellitus (HCC)    diet  and exercise controlled    Past Surgical History:  Procedure Laterality Date   BREAST CYST ASPIRATION Left    CERVICAL CONIZATION W/BX N/A 04/19/2018   Procedure: CONIZATION CERVIX WITH BIOPSY;  Surgeon: Hal Morales, MD;  Location: [facility];  Service: Gynecology;  Laterality: N/A;   CESAREAN SECTION     CHOLECYSTECTOMY     TUBAL LIGATION      Prior to Admission medications   Medication Sig Start Date End Date Taking? Authorizing Provider  Ascorbic Acid (VITAMIN C) POWD Take by mouth. 1/2 tsp daily    Yes [provider]  aspirin 81 MG chewable tablet Monday and Wednesday and Friday   Yes [provider]  atorvastatin (LIPITOR) 40 MG tablet TAKE 1 TABLET DAILY MONDAY THROUGH SATURDAY, SKIP SUNDAYS 10/25/22  Yes Dorothyann Peng, MD  Blood Glucose Monitoring Suppl KIT Use to check blood sugar 3 times daily. Dx code: e11.65 Dispense based on plan coverage with supplies. 06/21/22  Yes Dorothyann Peng, MD  Cholecalciferol (VITAMIN D3) 50 MCG (2000 UT) capsule Take 5,000 Units by mouth daily.    Yes [provider]  Coenzyme Q10 10 MG capsule    Yes [provider]   Insulin Pen Needle (PEN NEEDLES) 32G X 4 MM MISC Use as directed with insulin 05/23/22  Yes Dorothyann Peng, MD  loratadine (CLARITIN) 10 MG tablet Take 1 tablet (10 mg total) by mouth daily. 09/21/20  Yes Dorothyann Peng, MD  MAGNESIUM PO    Yes [provider]  PSYLLIUM HUSK PO Take 1 Dose by mouth daily at 6 (six) AM.   Yes [provider]  triamcinolone cream (KENALOG) 0.1 % APPLY DAILY TO SKIN TO AFFECTED AREA EVERY DAY FOR 30 DAYS 14 10/31/22  Yes Dorothyann Peng, MD  valsartan (DIOVAN) 160 MG tablet TAKE 1 TABLET BY MOUTH EVERY DAY 10/25/22  Yes Dorothyann Peng, MD  Continuous Blood Gluc Receiver (DEXCOM G6 RECEIVER) DEVI USE TO CHECK BLOOD SUGAR 3 TIMES DAILY. DX CODE: E11.65 Patient not taking: Reported on 06/22/2023 09/01/22   Dorothyann Peng, MD  Continuous Blood Gluc Transmit (DEXCOM G6 TRANSMITTER) MISC Use to check blood sugar 3 times daily. Dx code: e11.65 Patient not taking: Reported on 06/19/2023 09/08/22   Dorothyann Peng, MD  fluticasone [facility]) 50 MCG/ACT nasal spray SPRAY ONE SPRAY INTO EACH NOSTRIL DAILY Patient not taking: Reported on 07/03/2023 12/06/21   Dorothyann Peng, MD  Semaglutide, 1 MG/DOSE, (OZEMPIC, 1 MG/DOSE,) 4 MG/3ML SOPN INJECT 1MG  INTO THE SKIN ONCE A WEEK 01/20/23   Dorothyann Peng, MD    Current Outpatient Medications  Medication Sig Dispense Refill  Ascorbic Acid (VITAMIN C) POWD Take by mouth. 1/2 tsp daily      aspirin 81 MG chewable tablet Monday and Wednesday and Friday     atorvastatin (LIPITOR) 40 MG tablet TAKE 1 TABLET DAILY MONDAY THROUGH SATURDAY, SKIP SUNDAYS 90 tablet 1   Blood Glucose Monitoring Suppl KIT Use to check blood sugar 3 times daily. Dx code: e11.65 Dispense based on plan coverage with supplies. 1 kit 10   Cholecalciferol (VITAMIN D3) 50 MCG (2000 UT) capsule Take 5,000 Units by mouth daily.      Coenzyme Q10 10 MG capsule      Insulin Pen Needle (PEN NEEDLES) 32G X 4 MM MISC Use as directed with insulin 100 each 2    loratadine (CLARITIN) 10 MG tablet Take 1 tablet (10 mg total) by mouth daily. 90 tablet 1   MAGNESIUM PO      PSYLLIUM HUSK PO Take 1 Dose by mouth daily at 6 (six) AM.     triamcinolone cream (KENALOG) 0.1 % APPLY DAILY TO SKIN TO AFFECTED AREA EVERY DAY FOR 30 DAYS 14 30 g 1   valsartan (DIOVAN) 160 MG tablet TAKE 1 TABLET BY MOUTH EVERY DAY 90 tablet 2   Continuous Blood Gluc Receiver (DEXCOM G6 RECEIVER) DEVI USE TO CHECK BLOOD SUGAR 3 TIMES DAILY. DX CODE: E11.65 (Patient not taking: Reported on 06/22/2023) 1 each 0   Continuous Blood Gluc Transmit (DEXCOM G6 TRANSMITTER) MISC Use to check blood sugar 3 times daily. Dx code: e11.65 (Patient not taking: Reported on 06/19/2023) 1 each 10   fluticasone (FLONASE) 50 MCG/ACT nasal spray SPRAY ONE SPRAY INTO EACH NOSTRIL DAILY (Patient not taking: Reported on 07/03/2023) 48 mL 1   Semaglutide, 1 MG/DOSE, (OZEMPIC, 1 MG/DOSE,) 4 MG/3ML SOPN INJECT 1MG  INTO THE SKIN ONCE A WEEK 3 mL 5   Current Facility-Administered Medications  Medication Dose Route Frequency Provider Last Rate Last Admin   0.9 %  sodium chloride infusion  500 mL Intravenous Once Napoleon Form, MD        Allergies as of 07/03/2023   (No Known Allergies)    Family History  Problem Relation Age of Onset   Healthy Mother    Pneumonia Father    Breast cancer Sister 52   Colon cancer Maternal Aunt 74   Cancer Other    Hypertension Other    Hyperlipidemia Other    Colon polyps Neg Hx    Esophageal cancer Neg Hx    Rectal cancer Neg Hx    Stomach cancer Neg Hx     Social History   Socioeconomic History   Marital status: Single    Spouse name: Not on file   Number of children: Not on file   Years of education: Not on file   Highest education level: Not on file  Occupational History   Not on file  Tobacco Use   Smoking status: Never   Smokeless tobacco: Never  Vaping Use   Vaping status: Never Used  Substance and Sexual Activity   Alcohol use: Not  Currently    Comment: occ wine   Drug use: Never   Sexual activity: Not on file  Other Topics Concern   Not on file  Social History Narrative   Not on file   Social Drivers of Health   Financial Resource Strain: Not on file  Food Insecurity: Not on file  Transportation Needs: Not on file  Physical Activity: Not on file  Stress: Not on file  Social Connections: Not on file  Intimate Partner Violence: Not on file    Review of Systems:  All other review of systems negative except as mentioned in the HPI.  Physical Exam: Vital signs in last 24 hours: BP 126/73   Temp (!) 97.4 F (36.3 C) (Skin)   Ht 5\' 5"  (1.651 m)   Wt 198 lb (89.8 kg)   SpO2 97%   BMI 32.95 kg/m  General:   Alert, NAD Lungs:  Clear .   Heart:  Regular rate and rhythm Abdomen:  Soft, nontender and nondistended. Neuro/Psych:  Alert and cooperative. Normal mood and affect. A and O x 3  Reviewed labs, radiology imaging, old records and pertinent past GI work up  Patient is appropriate for planned procedure(s) and anesthesia in an ambulatory setting   K. Scherry Ran , MD [phone]

## 2023-07-03 NOTE — Progress Notes (Signed)
Sedate, gd SR, tolerated procedure well, VSS, report to RN 

## 2023-07-03 NOTE — Op Note (Signed)
[facility] Patient Name: Nichole George Procedure Date: 07/03/2023 8:36 AM MRN: [id number] Endoscopist: Napoleon Form , MD, [id number] Age: 62 Referring MD:  Date of Birth: 07/01/1961 Gender: Female Account #: [id number] Procedure:                Colonoscopy Indications:              Screening for colorectal malignant neoplasm,                            Screening for colon cancer: Family history of                            colorectal cancer in distant relative(maternal aunt) Medicines:                Monitored Anesthesia Care Procedure:                Pre-Anesthesia Assessment:                           - Prior to the procedure, a History and Physical                            was performed, and patient medications and                            allergies were reviewed. The patient's tolerance of                            previous anesthesia was also reviewed. The risks                            and benefits of the procedure and the sedation                            options and risks were discussed with the patient.                            All questions were answered, and informed consent                            was obtained. Prior Anticoagulants: The patient has                            taken no anticoagulant or antiplatelet agents. ASA                            Grade Assessment: II - A patient with mild systemic                            disease. After reviewing the risks and benefits,                            the patient was deemed in satisfactory condition to  undergo the procedure.                           After obtaining informed consent, the colonoscope                            was passed under direct vision. Throughout the                            procedure, the patient's blood pressure, pulse, and                            oxygen saturations were monitored continuously. The                             [id number]-HQ190L Colonoscope [id number] was introduced                            through the anus and advanced to the the cecum,                            identified by appendiceal orifice and ileocecal                            valve. The colonoscopy was somewhat difficult due                            to restricted mobility of the colon, a tortuous                            colon and the patient's body habitus. Successful                            completion of the procedure was aided by applying                            abdominal pressure. The patient tolerated the                            procedure well. The quality of the bowel                            preparation was good. The ileocecal valve,                            appendiceal orifice, and rectum were photographed. Scope In: 8:40:47 AM Scope Out: 9:15:12 AM Scope Withdrawal Time: 0 hours 8 minutes 6 seconds  Total Procedure Duration: 0 hours 34 minutes 25 seconds  Findings:                 Non-bleeding external and internal hemorrhoids were                            found during retroflexion. The hemorrhoids were  medium-sized.                           The exam was otherwise without abnormality. Complications:            No immediate complications. Estimated Blood Loss:     Estimated blood loss: none. Impression:               - Non-bleeding external and internal hemorrhoids.                           - The examination was otherwise normal.                           - No specimens collected. Recommendation:           - Patient has a contact number available for                            emergencies. The signs and symptoms of potential                            delayed complications were discussed with the                            patient. Return to normal activities tomorrow.                            Written discharge instructions were provided to the                            patient.                            - Resume previous diet.                           - Continue present medications.                           - Repeat colonoscopy in 10 years for surveillance. Napoleon Form, MD 07/03/2023 9:19:22 AM This report has been signed electronically.

## 2023-07-04 ENCOUNTER — Other Ambulatory Visit: Payer: Self-pay | Admitting: Internal Medicine

## 2023-07-04 ENCOUNTER — Telehealth: Payer: Self-pay

## 2023-07-04 NOTE — Telephone Encounter (Signed)
  Follow up Call-     07/03/2023    7:45 AM  Call back number  Post procedure Call Back phone  # [phone]  Permission to leave phone message Yes     Patient questions:  Do you have a fever, pain , or abdominal swelling? No. Pain Score  0 *  Have you tolerated food without any problems? Yes.    Have you been able to return to your normal activities? Yes.    Do you have any questions about your discharge instructions: Diet   No. Medications  No. Follow up visit  No.  Do you have questions or concerns about your Care? No.  Actions: * If pain score is 4 or above: No action needed, pain <4.

## 2023-08-02 LAB — HM DIABETES EYE EXAM

## 2023-08-08 ENCOUNTER — Ambulatory Visit: Payer: BC Managed Care – PPO | Admitting: Internal Medicine

## 2023-08-11 ENCOUNTER — Encounter: Payer: Self-pay | Admitting: Plastic Surgery

## 2023-08-11 ENCOUNTER — Ambulatory Visit: Payer: 59 | Admitting: Podiatry

## 2023-08-11 ENCOUNTER — Ambulatory Visit (INDEPENDENT_AMBULATORY_CARE_PROVIDER_SITE_OTHER): Payer: Self-pay | Admitting: Plastic Surgery

## 2023-08-11 DIAGNOSIS — D1801 Hemangioma of skin and subcutaneous tissue: Secondary | ICD-10-CM

## 2023-08-11 NOTE — Progress Notes (Signed)
 Preoperative Dx: angioma of lower lip  Postoperative Dx:  same  Procedure: laser to lower lip angioma 1.5 cm   Anesthesia: none  Description of Procedure:  Risks and complications were explained to the patient. Consent was confirmed and signed. Eye protection was placed. Time out was called and all information was confirmed to be correct. The area  area was prepped with alcohol and wiped dry. The heroic laser was set at  590nm and 16 J/cm2. The lip was lasered. The patient tolerated the procedure well and there were no complications. The patient is to follow up in 4 weeks.

## 2023-08-29 ENCOUNTER — Other Ambulatory Visit: Payer: Self-pay | Admitting: Internal Medicine

## 2023-09-03 ENCOUNTER — Other Ambulatory Visit: Payer: Self-pay | Admitting: Internal Medicine

## 2023-09-04 ENCOUNTER — Encounter: Payer: Self-pay | Admitting: Internal Medicine

## 2023-09-05 ENCOUNTER — Other Ambulatory Visit: Payer: Self-pay

## 2023-09-05 MED ORDER — FLUTICASONE PROPIONATE 50 MCG/ACT NA SUSP: NASAL | 1 refills | Status: AC

## 2023-09-08 ENCOUNTER — Ambulatory Visit: Payer: 59 | Admitting: Podiatry

## 2023-09-08 DIAGNOSIS — L600 Ingrowing nail: Secondary | ICD-10-CM

## 2023-09-08 NOTE — Progress Notes (Signed)
 Subjective:  Patient ID: Nichole George, female    DOB: Jan 18, 1961,  MRN: [id number]  Chief Complaint  Patient presents with   Nail Problem    63 y.o. female presents with the above complaint.  Patient presents with right hallux medial border ingrown painful to touch.  The lateral side is doing good denies any other acute complaints would like to have the medial side removed.  Pain scale 7 out of 10 dull aching nature   Review of Systems: Negative except as noted in the HPI. Denies N/V/F/Ch.  Past Medical History:  Diagnosis Date   Arthritis    generalized   Cervical dysplasia, moderate    Hyperlipidemia    on meds   Hypertension    on meds   Osteoporosis    Type 2 diabetes mellitus (HCC)    diet  and exercise controlled    Current Outpatient Medications:    Ascorbic Acid (VITAMIN C) POWD, Take by mouth. 1/2 tsp daily , Disp: , Rfl:    aspirin 81 MG chewable tablet, Monday and Wednesday and Friday, Disp: , Rfl:    atorvastatin (LIPITOR) 40 MG tablet, TAKE 1 TABLET DAILY MONDAY THROUGH SATURDAY, SKIP SUNDAYS, Disp: 90 tablet, Rfl: 1   Blood Glucose Monitoring Suppl KIT, Use to check blood sugar 3 times daily. Dx code: e11.65 Dispense based on plan coverage with supplies., Disp: 1 kit, Rfl: 10   Cholecalciferol (VITAMIN D3) 50 MCG (2000 UT) capsule, Take 5,000 Units by mouth daily. , Disp: , Rfl:    Coenzyme Q10 10 MG capsule, , Disp: , Rfl:    Continuous Blood Gluc Receiver (DEXCOM G6 RECEIVER) DEVI, USE TO CHECK BLOOD SUGAR 3 TIMES DAILY. DX CODE: E11.65, Disp: 1 each, Rfl: 0   Continuous Blood Gluc Transmit (DEXCOM G6 TRANSMITTER) MISC, Use to check blood sugar 3 times daily. Dx code: e11.65, Disp: 1 each, Rfl: 10   fluticasone (FLONASE) 50 MCG/ACT nasal spray, SPRAY ONE SPRAY INTO EACH NOSTRIL DAILY, Disp: 48 mL, Rfl: 1   Insulin Pen Needle (PEN NEEDLES) 32G X 4 MM MISC, Use as directed with insulin, Disp: 100 each, Rfl: 2   loratadine (CLARITIN) 10 MG tablet, Take 1 tablet  (10 mg total) by mouth daily., Disp: 90 tablet, Rfl: 1   MAGNESIUM PO, , Disp: , Rfl:    PSYLLIUM HUSK PO, Take 1 Dose by mouth daily at 6 (six) AM., Disp: , Rfl:    Semaglutide, 1 MG/DOSE, (OZEMPIC, 1 MG/DOSE,) 4 MG/3ML SOPN, INJECT 1 MG INTO THE SKIN ONE TIME PER WEEK, Disp: 3 mL, Rfl: 5   triamcinolone cream (KENALOG) 0.1 %, APPLY DAILY TO SKIN TO AFFECTED AREA EVERY DAY FOR 30 DAYS 14, Disp: 30 g, Rfl: 1   valsartan (DIOVAN) 160 MG tablet, TAKE 1 TABLET BY MOUTH EVERY DAY, Disp: 90 tablet, Rfl: 2  Social History   Tobacco Use  Smoking Status Never  Smokeless Tobacco Never    No Known Allergies Objective:  There were no vitals filed for this visit. There is no height or weight on file to calculate BMI. Constitutional Well developed. Well nourished.  Vascular Dorsalis pedis pulses palpable bilaterally. Posterior tibial pulses palpable bilaterally. Capillary refill normal to all digits.  No cyanosis or clubbing noted. Pedal hair growth normal.  Neurologic Normal speech. Oriented to person, place, and time. Epicritic sensation to light touch grossly present bilaterally.  Dermatologic Painful ingrowing nail at medial nail borders of the hallux nail right. No other open wounds. No skin  lesions.  Orthopedic: Normal joint ROM without pain or crepitus bilaterally. No visible deformities. No bony tenderness.   Radiographs: None Assessment:  No diagnosis found. Plan:  Patient was evaluated and treated and all questions answered.  Ingrown Nail, right -Patient elects to proceed with minor surgery to remove ingrown toenail removal today. Consent reviewed and signed by patient. -Ingrown nail excised. See procedure note. -Educated on post-procedure care including soaking. Written instructions provided and reviewed. -Patient to follow up in 2 weeks for nail check.  Procedure: Excision of Ingrown Toenail Location: Right 1st toe medial nail borders. Anesthesia: Lidocaine 1% plain;  1.5 mL and Marcaine 0.5% plain; 1.5 mL, digital block. Skin Prep: Betadine. Dressing: Silvadene; telfa; dry, sterile, compression dressing. Technique: Following skin prep, the toe was exsanguinated and a tourniquet was secured at the base of the toe. The affected nail border was freed, split with a nail splitter, and excised. Chemical matrixectomy was then performed with phenol and irrigated out with alcohol. The tourniquet was then removed and sterile dressing applied. Disposition: Patient tolerated procedure well. Patient to return in 2 weeks for follow-up.   No follow-ups on file.

## 2023-09-09 ENCOUNTER — Other Ambulatory Visit: Payer: Self-pay | Admitting: Internal Medicine

## 2023-09-18 ENCOUNTER — Encounter: Payer: Self-pay | Admitting: Internal Medicine

## 2023-09-26 ENCOUNTER — Encounter: Payer: Self-pay | Admitting: Plastic Surgery

## 2023-09-26 ENCOUNTER — Ambulatory Visit (INDEPENDENT_AMBULATORY_CARE_PROVIDER_SITE_OTHER): Payer: Self-pay | Admitting: Plastic Surgery

## 2023-09-26 DIAGNOSIS — D1801 Hemangioma of skin and subcutaneous tissue: Secondary | ICD-10-CM

## 2023-09-26 NOTE — Progress Notes (Signed)
 Preoperative Dx: Angioma of lower lip  Postoperative Dx:  same  Procedure: laser to lower lip  Anesthesia: none  Description of Procedure:  Risks and complications were explained to the patient. Consent was confirmed and signed. Eye protection was placed. Time out was called and all information was confirmed to be correct. The area  area was prepped with alcohol and wiped dry. The heroic laser was set at 590 nm 18 J/cm2. The lip was lasered. The patient tolerated the procedure well and there were no complications. The patient is to follow up in 4 weeks.

## 2023-10-16 ENCOUNTER — Ambulatory Visit: Payer: BC Managed Care – PPO | Admitting: Internal Medicine

## 2023-10-16 ENCOUNTER — Encounter: Payer: Self-pay | Admitting: Internal Medicine

## 2023-10-16 VITALS — BP 128/80 | HR 83 | Temp 98.0°F | Ht 65.0 in | Wt 206.0 lb

## 2023-10-16 DIAGNOSIS — E66811 Obesity, class 1: Secondary | ICD-10-CM

## 2023-10-16 DIAGNOSIS — Z6834 Body mass index (BMI) 34.0-34.9, adult: Secondary | ICD-10-CM

## 2023-10-16 DIAGNOSIS — E785 Hyperlipidemia, unspecified: Secondary | ICD-10-CM

## 2023-10-16 DIAGNOSIS — J301 Allergic rhinitis due to pollen: Secondary | ICD-10-CM | POA: Diagnosis not present

## 2023-10-16 DIAGNOSIS — E6609 Other obesity due to excess calories: Secondary | ICD-10-CM

## 2023-10-16 DIAGNOSIS — I10 Essential (primary) hypertension: Secondary | ICD-10-CM

## 2023-10-16 DIAGNOSIS — E1169 Type 2 diabetes mellitus with other specified complication: Secondary | ICD-10-CM | POA: Diagnosis not present

## 2023-10-16 MED ORDER — OZEMPIC (1 MG/DOSE) 4 MG/3ML ~~LOC~~ SOPN: PEN_INJECTOR | SUBCUTANEOUS | 5 refills | Status: DC

## 2023-10-16 NOTE — Assessment & Plan Note (Addendum)
 Chronic, LDL goal is less than 70. She will continue with atorvastatin 40mg  daily.  Blood sugar readings are not consistently fasting. Emphasized importance of fasting readings for accurate assessment and medication management. - Instruct to provide fasting blood sugar readings at least twice a week. - Continue Ozempic 1 mg once a week.

## 2023-10-16 NOTE — Assessment & Plan Note (Signed)
 Experiencing itchy eyes. Advised against children's Claritin. Suggested Zyrtec for better control. - Consider switching to Zyrtec. - Advise taking Zyrtec at night.

## 2023-10-16 NOTE — Assessment & Plan Note (Signed)
 Gained 8 pounds since December. Engages in regular physical activity. Discussed dietary habits. - Encourage continued physical activity with [employer] and gym workouts. - Discuss dietary modifications.

## 2023-10-16 NOTE — Assessment & Plan Note (Signed)
 Blood pressure is 128/80 mmHg, close to target of less than 120/80 mmHg. On valsartan 160 mg. - Continue valsartan 160 mg daily. - Encourage regular exercise. - Reminded to follow low sodium diet.

## 2023-10-16 NOTE — Patient Instructions (Signed)

## 2023-10-16 NOTE — Progress Notes (Signed)
 I,Victoria T Deloria Lair, CMA,acting as a Neurosurgeon for Gwynneth Aliment, MD.,have documented all relevant documentation on the behalf of Gwynneth Aliment, MD,as directed by  Gwynneth Aliment, MD while in the presence of Gwynneth Aliment, MD.  Subjective:  Patient ID: Nichole George , female    DOB: 05/22/1961 , 63 y.o.   MRN: [id number]  Chief Complaint  Patient presents with   Diabetes    Patient presents today for dm & bpc. She reports compliance with medications. Denies headache, chest pain & sob. She has no specific questions or concerns.    Hypertension    HPI Discussed the use of AI scribe software for clinical note transcription with the patient, who gave verbal consent to proceed.  History of Present Illness Nichole George is a 63 year old female with diabetes and hypertension who presents for management of her blood sugar and blood pressure.  Her blood sugar levels in the morning are around 112 mg/dL after eating. She is unsure of her fasting blood sugar levels as she typically checks them after having a smoothie at work.  She is currently taking aspirin on Monday, Wednesday, and Friday, atorvastatin 40 mg from Monday through Saturday, loratadine 10mg  daily, and valsartan 160 mg daily. She also takes Ozempic 1 mg once a week. She uses children's Claritin for itchy eyes but prefers non-drowsy medications like loratadine due to side effects from others.  She is actively engaged in physical activity, walking three miles with a group called [employer] on Mondays and Wednesdays, and attending the gym on Tuesdays, Thursdays, and Saturdays. She walks about five miles and does strength training, benefiting from a free gym membership due to her employment there.  She is scheduled for a bone density test at the end of the month at the [facility] center.      Diabetes She presents for her initial diabetic visit. She has type 2 diabetes mellitus. The initial diagnosis of diabetes was made 4 weeks  ago. There are no hypoglycemic associated symptoms. Pertinent negatives for diabetes include no blurred vision and no chest pain. There are no hypoglycemic complications. Risk factors for coronary artery disease include diabetes mellitus, hypertension, post-menopausal and obesity. She is following a diabetic diet. She participates in exercise three times a week. Eye exam is current.  Hypertension This is a chronic problem. The problem has been gradually improving since onset. The problem is uncontrolled. Pertinent negatives include no blurred vision, chest pain, palpitations or shortness of breath. The current treatment provides moderate improvement. Compliance problems include exercise.      Past Medical History:  Diagnosis Date   Arthritis    generalized   Cervical dysplasia, moderate    Hyperlipidemia    on meds   Hypertension    on meds   Osteoporosis    Type 2 diabetes mellitus (HCC)    diet  and exercise controlled     Family History  Problem Relation Age of Onset   Healthy Mother    Pneumonia Father    Breast cancer Sister 63   Colon cancer Maternal Aunt 22   Cancer Other    Hypertension Other    Hyperlipidemia Other    Colon polyps Neg Hx    Esophageal cancer Neg Hx    Rectal cancer Neg Hx    Stomach cancer Neg Hx      Current Outpatient Medications:    Ascorbic Acid (VITAMIN C) POWD, Take by mouth. 1/2 tsp daily , Disp: , Rfl:  aspirin 81 MG chewable tablet, Monday and Wednesday and Friday, Disp: , Rfl:    atorvastatin (LIPITOR) 40 MG tablet, TAKE 1 TABLET DAILY MONDAY THROUGH SATURDAY, SKIP SUNDAYS, Disp: 90 tablet, Rfl: 1   Blood Glucose Monitoring Suppl KIT, Use to check blood sugar 3 times daily. Dx code: e11.65 Dispense based on plan coverage with supplies., Disp: 1 kit, Rfl: 10   Cholecalciferol (VITAMIN D3) 50 MCG (2000 UT) capsule, Take 5,000 Units by mouth daily. , Disp: , Rfl:    Coenzyme Q10 10 MG capsule, , Disp: , Rfl:    Continuous Blood Gluc  Receiver (DEXCOM G6 RECEIVER) DEVI, USE TO CHECK BLOOD SUGAR 3 TIMES DAILY. DX CODE: E11.65, Disp: 1 each, Rfl: 0   Continuous Blood Gluc Transmit (DEXCOM G6 TRANSMITTER) MISC, Use to check blood sugar 3 times daily. Dx code: e11.65, Disp: 1 each, Rfl: 10   fluticasone (FLONASE) 50 MCG/ACT nasal spray, SPRAY ONE SPRAY INTO EACH NOSTRIL DAILY, Disp: 48 mL, Rfl: 1   Insulin Pen Needle (PEN NEEDLES) 32G X 4 MM MISC, Use as directed with insulin, Disp: 100 each, Rfl: 2   loratadine (CLARITIN) 10 MG tablet, Take 1 tablet (10 mg total) by mouth daily., Disp: 90 tablet, Rfl: 1   MAGNESIUM PO, , Disp: , Rfl:    PSYLLIUM HUSK PO, Take 1 Dose by mouth daily at 6 (six) AM., Disp: , Rfl:    triamcinolone cream (KENALOG) 0.1 %, APPLY DAILY TO SKIN TO AFFECTED AREA EVERY DAY FOR 30 DAYS 14, Disp: 30 g, Rfl: 1   valsartan (DIOVAN) 160 MG tablet, TAKE 1 TABLET BY MOUTH EVERY DAY, Disp: 90 tablet, Rfl: 2   Semaglutide, 1 MG/DOSE, (OZEMPIC, 1 MG/DOSE,) 4 MG/3ML SOPN, INJECT 1 MG INTO THE SKIN ONE TIME PER WEEK, Disp: 3 mL, Rfl: 5   No Known Allergies   Review of Systems  Constitutional: Negative.   Eyes:  Negative for blurred vision.  Respiratory: Negative.  Negative for shortness of breath.   Cardiovascular: Negative.  Negative for chest pain and palpitations.  Gastrointestinal: Negative.   Neurological: Negative.   Psychiatric/Behavioral: Negative.       Today's Vitals   10/16/23 1444  BP: 128/80  Pulse: 83  Temp: 98 F (36.7 C)  SpO2: 98%  Weight: 206 lb (93.4 kg)  Height: 5\' 5"  (1.651 m)   Body mass index is 34.28 kg/m.  Wt Readings from Last 3 Encounters:  10/16/23 206 lb (93.4 kg)  07/03/23 198 lb (89.8 kg)  06/22/23 202 lb 12.8 oz (92 kg)     Objective:  Physical Exam Vitals and nursing note reviewed.  Constitutional:      Appearance: Normal appearance. She is obese.  HENT:     Head: Normocephalic and atraumatic.  Eyes:     Extraocular Movements: Extraocular movements intact.   Cardiovascular:     Rate and Rhythm: Normal rate and regular rhythm.     Heart sounds: Normal heart sounds.  Pulmonary:     Effort: Pulmonary effort is normal.     Breath sounds: Normal breath sounds.  Musculoskeletal:     Cervical back: Normal range of motion.  Skin:    General: Skin is warm.  Neurological:     General: No focal deficit present.     Mental Status: She is alert.  Psychiatric:        Mood and Affect: Mood normal.        Behavior: Behavior normal.  Assessment And Plan:  Dyslipidemia associated with type 2 diabetes mellitus (HCC) Assessment & Plan: Chronic, LDL goal is less than 70. She will continue with atorvastatin 40mg  daily.  Blood sugar readings are not consistently fasting. Emphasized importance of fasting readings for accurate assessment and medication management. - Instruct to provide fasting blood sugar readings at least twice a week. - Continue Ozempic 1 mg once a week.  Orders: -     CMP14+EGFR -     Hemoglobin A1c  Chronic hypertension Assessment & Plan: Blood pressure is 128/80 mmHg, close to target of less than 120/80 mmHg. On valsartan 160 mg. - Continue valsartan 160 mg daily. - Encourage regular exercise. - Reminded to follow low sodium diet.   Orders: -     CMP14+EGFR  Seasonal allergic rhinitis due to pollen Assessment & Plan: Experiencing itchy eyes. Advised against children's Claritin. Suggested Zyrtec for better control. - Consider switching to Zyrtec. - Advise taking Zyrtec at night.   Class 1 obesity due to excess calories with serious comorbidity and body mass index (BMI) of 34.0 to 34.9 in adult Assessment & Plan: Gained 8 pounds since December. Engages in regular physical activity. Discussed dietary habits. - Encourage continued physical activity with [employer] and gym workouts. - Discuss dietary modifications.   Other orders -     Ozempic (1 MG/DOSE); INJECT 1 MG INTO THE SKIN ONE TIME PER WEEK   Dispense: 3 mL; Refill: 5  General Health Maintenance Scheduled for bone density test and physical. - Ensure completion of bone density test on April 28th. - Attend scheduled physical in four months.  Return if symptoms worsen or fail to improve.  Patient was given opportunity to ask questions. Patient verbalized understanding of the plan and was able to repeat key elements of the plan. All questions were answered to their satisfaction.    I, Smiley Dung, MD, have reviewed all documentation for this visit. The documentation on 10/16/23 for the exam, diagnosis, procedures, and orders are all accurate and complete.   IF YOU HAVE BEEN REFERRED TO A SPECIALIST, IT MAY TAKE 1-2 WEEKS TO SCHEDULE/PROCESS THE REFERRAL. IF YOU HAVE NOT HEARD FROM US /SPECIALIST IN TWO WEEKS, PLEASE GIVE US  A CALL AT [phone].   THE PATIENT IS ENCOURAGED TO PRACTICE SOCIAL DISTANCING DUE TO THE COVID-19 PANDEMIC.

## 2023-10-17 ENCOUNTER — Encounter: Payer: Self-pay | Admitting: Internal Medicine

## 2023-10-17 LAB — CMP14+EGFR
ALT: 45 IU/L — ABNORMAL HIGH (ref 0–32)
AST: 36 IU/L (ref 0–40)
Albumin: 4.4 g/dL (ref 3.9–4.9)
Alkaline Phosphatase: 111 IU/L (ref 44–121)
BUN/Creatinine Ratio: 15 (ref 12–28)
BUN: 10 mg/dL (ref 8–27)
Bilirubin Total: 0.3 mg/dL (ref 0.0–1.2)
CO2: 22 mmol/L (ref 20–29)
Calcium: 8.9 mg/dL (ref 8.7–10.3)
Chloride: 104 mmol/L (ref 96–106)
Creatinine, Ser: 0.67 mg/dL (ref 0.57–1.00)
Globulin, Total: 2.1 g/dL (ref 1.5–4.5)
Glucose: 77 mg/dL (ref 70–99)
Potassium: 3.9 mmol/L (ref 3.5–5.2)
Sodium: 141 mmol/L (ref 134–144)
Total Protein: 6.5 g/dL (ref 6.0–8.5)
eGFR: 98 mL/min/{1.73_m2} (ref >59)

## 2023-10-17 LAB — HEMOGLOBIN A1C
Est. average glucose Bld gHb Est-mCnc: 123 mg/dL
Hgb A1c MFr Bld: 5.9 % — ABNORMAL HIGH (ref 4.8–5.6)

## 2023-10-27 ENCOUNTER — Other Ambulatory Visit: Payer: Self-pay | Admitting: Plastic Surgery

## 2023-10-27 ENCOUNTER — Encounter: Payer: Self-pay | Admitting: Internal Medicine

## 2023-10-30 ENCOUNTER — Encounter: Payer: Self-pay | Admitting: Internal Medicine

## 2023-10-30 ENCOUNTER — Ambulatory Visit
Admission: RE | Admit: 2023-10-30 | Discharge: 2023-10-30 | Disposition: A | Discharge status: Home / Self Care | Payer: BC Managed Care – PPO | Source: Ambulatory Visit | Attending: Obstetrics and Gynecology | Admitting: Obstetrics and Gynecology

## 2023-10-30 DIAGNOSIS — M8580 Other specified disorders of bone density and structure, unspecified site: Secondary | ICD-10-CM

## 2023-10-31 ENCOUNTER — Encounter: Payer: Self-pay | Admitting: Internal Medicine

## 2023-12-05 ENCOUNTER — Ambulatory Visit (INDEPENDENT_AMBULATORY_CARE_PROVIDER_SITE_OTHER): Payer: Self-pay | Admitting: Plastic Surgery

## 2023-12-05 DIAGNOSIS — D1801 Hemangioma of skin and subcutaneous tissue: Secondary | ICD-10-CM

## 2023-12-05 NOTE — Progress Notes (Signed)
 Preoperative Dx: angioma of lip  Postoperative Dx:  same  Procedure: laser to lip angioma   Anesthesia: none  Description of Procedure:  Risks and complications were explained to the patient. Consent was confirmed and signed. Eye protection was placed. Time out was called and all information was confirmed to be correct. The area  area was prepped with alcohol and wiped dry. The BBL Heroic laser was set at 590 nm at 14 J/cm2. The angioma was lasered. The patient tolerated the procedure well and there were no complications. The patient is to follow up in 4 weeks.

## 2023-12-12 ENCOUNTER — Encounter: Payer: Self-pay | Admitting: Internal Medicine

## 2024-01-11 ENCOUNTER — Encounter: Payer: Self-pay | Admitting: Internal Medicine

## 2024-01-11 ENCOUNTER — Ambulatory Visit: Attending: Internal Medicine

## 2024-01-11 ENCOUNTER — Ambulatory Visit: Admitting: Internal Medicine

## 2024-01-11 VITALS — BP 122/82 | HR 89 | Temp 98.4°F | Ht 65.0 in | Wt 203.6 lb

## 2024-01-11 DIAGNOSIS — M5412 Radiculopathy, cervical region: Secondary | ICD-10-CM | POA: Diagnosis not present

## 2024-01-11 DIAGNOSIS — R002 Palpitations: Secondary | ICD-10-CM

## 2024-01-11 DIAGNOSIS — R400 Somnolence: Secondary | ICD-10-CM

## 2024-01-11 DIAGNOSIS — E1169 Type 2 diabetes mellitus with other specified complication: Secondary | ICD-10-CM

## 2024-01-11 DIAGNOSIS — R519 Headache, unspecified: Secondary | ICD-10-CM

## 2024-01-11 DIAGNOSIS — I10 Essential (primary) hypertension: Secondary | ICD-10-CM

## 2024-01-11 DIAGNOSIS — E66811 Obesity, class 1: Secondary | ICD-10-CM

## 2024-01-11 DIAGNOSIS — E785 Hyperlipidemia, unspecified: Secondary | ICD-10-CM

## 2024-01-11 DIAGNOSIS — R202 Paresthesia of skin: Secondary | ICD-10-CM

## 2024-01-11 MED ORDER — CYCLOBENZAPRINE HCL 5 MG PO TABS: ORAL_TABLET | ORAL | 0 refills | Status: DC

## 2024-01-11 NOTE — Progress Notes (Signed)
 I,[name], CMA,acting as a Neurosurgeon for [name], MD.,have documented all relevant documentation on the behalf of [name], MD,as directed by  [name], MD while in the presence of [name], MD.  Subjective:  Patient ID: Nichole George , female    DOB: Mar 09, 1961 , 63 y.o.   MRN: [id number]  Chief Complaint  Patient presents with   left arm pain    Patient presents today for left arm pain. The pain radiates from the left side of her head, down her arm, down into her left lef & foot. She also experiences numbness & tingling.  She also states If she lies on her back to sleep at night she will wake up  with a headache.  Denies chest pain, sob, blurred vision.     HPI Discussed the use of AI scribe software for clinical note transcription with the patient, who gave verbal consent to proceed.  History of Present Illness Nichole George is a 63 year old female with diabetes and hypertension who presents for evaluation of arm pain.  She experiences sporadic sharp, shock-like pains in her feet, legs, and arms, described as 'ping, ping'. She started taking B12 supplements at the end of May, which reduced the frequency of these pains, but they still occur occasionally. She also reports feeling nauseous at times.  She describes waking up sporadically with headaches, which she attributes to her sleeping position. Changing her sleeping position from her back to her side provided some relief, but the headaches persist. She also reports waking up gasping for air and notes that no sleep study has been conducted yet. She does not feel rested upon waking and denies snoring, but she does get up to urinate at night, especially if she drinks water before bed.  She mentions knee pain, which began in May after an injury. She received a cortisone shot by [employer] initially taking Celebrex, which she stopped due to suspected headache side effects. The knee pain has returned, and  she occasionally takes Tylenol for severe pain, although she was advised against it due to liver enzyme concerns.  She experiences sporadic palpitations, usually in the morning or during the day, occurring about every two weeks. No chest pain or shortness of breath during these episodes. She also notes numbness and tingling in her left arm, which she attributes to sleeping on her side. She has not been to the chiropractor recently, which she thinks might be contributing to her symptoms.   Diabetes She presents for her initial diabetic visit. She has type 2 diabetes mellitus. The initial diagnosis of diabetes was made 4 weeks ago. Hypoglycemia symptoms include headaches. Pertinent negatives for diabetes include no blurred vision and no chest pain. There are no hypoglycemic complications. Risk factors for coronary artery disease include diabetes mellitus, hypertension, post-menopausal and obesity. She is following a diabetic diet. She participates in exercise three times a week. Eye exam is current.  Hypertension This is a chronic problem. The problem has been gradually improving since onset. The problem is uncontrolled. Associated symptoms include headaches and palpitations. Pertinent negatives include no blurred vision, chest pain or shortness of breath. The current treatment provides moderate improvement. Compliance problems include exercise.      Past Medical History:  Diagnosis Date   Arthritis    generalized   Cervical dysplasia, moderate    Hyperlipidemia    on meds   Hypertension    on meds   Osteoporosis    Type  2 diabetes mellitus (HCC)    diet  and exercise controlled     Family History  Problem Relation Age of Onset   Healthy Mother    Pneumonia Father    Breast cancer Sister 32   Colon cancer Maternal Aunt 36   Cancer Other    Hypertension Other    Hyperlipidemia Other    Colon polyps Neg Hx    Esophageal cancer Neg Hx    Rectal cancer Neg Hx    Stomach cancer Neg Hx       Current Outpatient Medications:    Ascorbic Acid (VITAMIN C) POWD, Take by mouth. 1/2 tsp daily , Disp: , Rfl:    aspirin 81 MG chewable tablet, Monday and Wednesday and Friday, Disp: , Rfl:    atorvastatin  (LIPITOR) 40 MG tablet, TAKE 1 TABLET DAILY MONDAY THROUGH SATURDAY, SKIP SUNDAYS, Disp: 90 tablet, Rfl: 1   Blood Glucose Monitoring Suppl KIT, Use to check blood sugar 3 times daily. Dx code: e11.65 Dispense based on plan coverage with supplies., Disp: 1 kit, Rfl: 10   Cholecalciferol (VITAMIN D3) 50 MCG (2000 UT) capsule, Take 5,000 Units by mouth daily. , Disp: , Rfl:    Coenzyme Q10 10 MG capsule, , Disp: , Rfl:    Continuous Blood Gluc Receiver (DEXCOM G6 RECEIVER) DEVI, USE TO CHECK BLOOD SUGAR 3 TIMES DAILY. DX CODE: E11.65, Disp: 1 each, Rfl: 0   Continuous Blood Gluc Transmit (DEXCOM G6 TRANSMITTER) MISC, Use to check blood sugar 3 times daily. Dx code: e11.65, Disp: 1 each, Rfl: 10   cyclobenzaprine  (FLEXERIL ) 5 MG tablet, One tab po qp as needed, Disp: 30 tablet, Rfl: 0   fluticasone  (FLONASE ) 50 MCG/ACT nasal spray, SPRAY ONE SPRAY INTO EACH NOSTRIL DAILY, Disp: 48 mL, Rfl: 1   Insulin Pen Needle (PEN NEEDLES) 32G X 4 MM MISC, Use as directed with insulin, Disp: 100 each, Rfl: 2   loratadine  (CLARITIN ) 10 MG tablet, Take 1 tablet (10 mg total) by mouth daily., Disp: 90 tablet, Rfl: 1   MAGNESIUM PO, , Disp: , Rfl:    PSYLLIUM HUSK PO, Take 1 Dose by mouth daily at 6 (six) AM., Disp: , Rfl:    Semaglutide , 1 MG/DOSE, (OZEMPIC , 1 MG/DOSE,) 4 MG/3ML SOPN, INJECT 1 MG INTO THE SKIN ONE TIME PER WEEK, Disp: 3 mL, Rfl: 5   triamcinolone  cream (KENALOG ) 0.1 %, APPLY DAILY TO SKIN TO AFFECTED AREA EVERY DAY FOR 30 DAYS 14, Disp: 30 g, Rfl: 1   valsartan  (DIOVAN ) 160 MG tablet, TAKE 1 TABLET BY MOUTH EVERY DAY, Disp: 90 tablet, Rfl: 2   No Known Allergies   Review of Systems  Constitutional: Negative.   Eyes:  Negative for blurred vision.  Respiratory: Negative.  Negative for  shortness of breath.   Cardiovascular:  Positive for palpitations. Negative for chest pain.  Neurological:  Positive for numbness and headaches.  Psychiatric/Behavioral: Negative.       Today's Vitals   01/11/24 1534  BP: 122/82  Pulse: 89  Temp: 98.4 F (36.9 C)  SpO2: 98%  Weight: 203 lb 9.6 oz (92.4 kg)  Height: 5' 5 (1.651 m)   Body mass index is 33.88 kg/m.  Wt Readings from Last 3 Encounters:  01/11/24 203 lb 9.6 oz (92.4 kg)  10/16/23 206 lb (93.4 kg)  07/03/23 198 lb (89.8 kg)     Objective:  Physical Exam Vitals and nursing note reviewed.  Constitutional:      Appearance: Normal appearance.  HENT:     Head: Normocephalic and atraumatic.  Eyes:     Extraocular Movements: Extraocular movements intact.  Cardiovascular:     Rate and Rhythm: Normal rate and regular rhythm.     Heart sounds: Normal heart sounds.  Pulmonary:     Effort: Pulmonary effort is normal.     Breath sounds: Normal breath sounds.  Musculoskeletal:     Cervical back: Normal range of motion. Tenderness present.  Skin:    General: Skin is warm.  Neurological:     General: No focal deficit present.     Mental Status: She is alert.  Psychiatric:        Mood and Affect: Mood normal.        Behavior: Behavior normal.         Assessment And Plan:  New onset headache Assessment & Plan: Intermittent symptoms, occur upon awakening. - Possibly related to OSA - Advised to sleep on her side.  - Stay well hydrated  Orders: -     CBC  Daytime somnolence Assessment & Plan: Symptoms suggest sleep apnea. She also reports early morning headaches and non-restorative sleep.  She also admits to falling asleep while watching TV and reading books.  No prior sleep study. - Order home-based sleep study. - Refer to neurologist.  Orders: -     Ambulatory referral to Neurology  Cervical radiculopathy Assessment & Plan: Numbness and tingling in left arm possibly due to muscle tightness. -  Prescribe muscle relaxer at night as needed. - Encourage use of Vicks for neck tightness. - Advise on posture correction and exercises. - Consider returning to Chiro for ART/laser therapy  Orders: -     Vitamin B12 -     TSH  Palpitations Assessment & Plan: Sporadic palpitations without chest pain or dyspnea. No prior heart monitoring. - Order 14-day heart monitor. - Unable to perform EKG, system is down - Check magnesium levels.  Orders: -     Magnesium -     LONG TERM MONITOR (3-14 DAYS); Future  Dyslipidemia associated with type 2 diabetes mellitus (HCC) Assessment & Plan: Chronic, no med changes. She will continue with Ozempic  1mg  weekly.   Orders: -     CMP14+EGFR -     Microalbumin / creatinine urine ratio  Chronic hypertension Assessment & Plan: Blood pressure is 128/80 mmHg, close to target of less than 120/80 mmHg. On valsartan  160 mg. - Continue valsartan  160 mg daily. - Encourage regular exercise. - Reminded to follow low sodium diet.    Other orders -     Cyclobenzaprine  HCl; One tab po qp as needed  Dispense: 30 tablet; Refill: 0   Return if symptoms worsen or fail to improve.  Patient was given opportunity to ask questions. Patient verbalized understanding of the plan and was able to repeat key elements of the plan. All questions were answered to their satisfaction.   I, [name], MD, have reviewed all documentation for this visit. The documentation on 01/11/24 for the exam, diagnosis, procedures, and orders are all accurate and complete.  IF YOU HAVE BEEN REFERRED TO A SPECIALIST, IT MAY TAKE 1-2 WEEKS TO SCHEDULE/PROCESS THE REFERRAL. IF YOU HAVE NOT HEARD FROM US /SPECIALIST IN TWO WEEKS, PLEASE GIVE US  A CALL AT [phone].   THE PATIENT IS ENCOURAGED TO PRACTICE SOCIAL DISTANCING DUE TO THE COVID-19 PANDEMIC.

## 2024-01-11 NOTE — Patient Instructions (Addendum)
 Cervical Radiculopathy  Cervical radiculopathy means that a nerve in the neck (a cervical nerve) is pinched or bruised. This can happen because of an injury to the cervical spine (vertebrae) in the neck, or as a normal part of getting older. This condition can cause pain or loss of feeling (numbness) that runs from your neck all the way down to your arm and fingers. Often, this condition gets better with rest. Treatment may be needed if the condition does not get better. What are the causes? A neck injury. A bulging disk in your spine. Sudden muscle tightening (muscle spasms). Tight muscles in your neck due to overuse. Arthritis. Breakdown in the bones and joints of the spine (spondylosis) due to getting older. Bone spurs that form near the nerves in the neck. What are the signs or symptoms? Pain. The pain may: Run from the neck to the arm and hand. Be very bad or irritating. Get worse when you move your neck. Loss of feeling or tingling in your arm or hand. Weakness in your arm or hand, in very bad cases. How is this treated? In many cases, treatment is not needed for this condition. With rest, the condition often gets better over time. If treatment is needed, options may include: Wearing a soft neck collar (cervical collar) for short periods of time. Doing exercises (physical therapy) to strengthen your neck muscles. Taking medicines. Having shots (injections) in your spine, in very bad cases. Having surgery. This may be needed if other treatments do not help. The type of surgery that is used will depend on the cause of your condition. Follow these instructions at home: If you have a soft neck collar: Wear it as told by your doctor. Take it off only as told by your doctor. Ask your doctor if you can take the collar off for cleaning and bathing. If you are allowed to take the collar off for cleaning or bathing: Follow instructions from your doctor about how to take off the collar  safely. Clean the collar by wiping it with mild soap and water and drying it completely. Take out any removable pads in the collar every 1-2 days. Wash them by hand with soap and water. Let them air-dry completely before you put them back in the collar. Check your skin under the collar for redness or sores. If you see any, tell your doctor. Managing pain     Take over-the-counter and prescription medicines only as told by your doctor. If told, put ice on the painful area. To do this: If you have a soft neck collar, take if off as told by your doctor. Put ice in a plastic bag. Place a towel between your skin and the bag. Leave the ice on for 20 minutes, 2-3 times a day. Take off the ice if your skin turns bright red. This is very important. If you cannot feel pain, heat, or cold, you have a greater risk of damage to the area. If using ice does not help, you can try using heat. Use the heat source that your doctor recommends, such as a moist heat pack or a heating pad. Place a towel between your skin and the heat source. Leave the heat on for 20-30 minutes. Take off the heat if your skin turns bright red. This is very important. If you cannot feel pain, heat, or cold, you have a greater risk of getting burned. You may try a gentle neck and shoulder rub (massage). Activity Rest as needed. Return  to your normal activities when your doctor says that it is safe. Do exercises as told by your doctor or physical therapist. You may have to avoid lifting. Ask your doctor how much you can safely lift. General instructions Use a flat pillow when you sleep. Do not drive while wearing a soft neck collar. If you do not have a soft neck collar, ask your doctor if it is safe to drive while your neck heals. Ask your doctor if you should avoid driving or using machines while you are taking your medicine. Do not smoke or use any products that contain nicotine or tobacco. If you need help quitting, ask your  doctor. Keep all follow-up visits. Contact a doctor if: Your condition does not get better with treatment. Get help right away if: Your pain gets worse and medicine does not help. You lose feeling or feel weak in your hand, arm, face, or leg. You have a high fever. Your neck is stiff. You cannot control when you poop or pee (have incontinence). You have trouble with walking, balance, or talking. Summary Cervical radiculopathy means that a nerve in the neck is pinched or bruised. A nerve can get pinched from a bulging disk, arthritis, an injury to the neck, or other causes. Symptoms include pain, tingling, or loss of feeling that goes from the neck to the arm or hand. Weakness in your arm or hand can happen in very bad cases. Treatment may include resting, wearing a soft neck collar, and doing exercises. You might need to take medicines for pain. In very bad cases, shots or surgery may be needed. This information is not intended to replace advice given to you by your health care provider. Make sure you discuss any questions you have with your health care provider. Document Revised: 12/24/2020 Document Reviewed: 12/24/2020 Elsevier Patient Education  2024 [employer].

## 2024-01-11 NOTE — Progress Notes (Unsigned)
 EP to read.

## 2024-01-12 LAB — CBC
Hematocrit: 43.2 % (ref 34.0–46.6)
Hemoglobin: 14.3 g/dL (ref 11.1–15.9)
MCH: 30.9 pg (ref 26.6–33.0)
MCHC: 33.1 g/dL (ref 31.5–35.7)
MCV: 93 fL (ref 79–97)
Platelets: 202 x10E3/uL (ref 150–450)
RBC: 4.63 x10E6/uL (ref 3.77–5.28)
RDW: 13 % (ref 11.7–15.4)
WBC: 7.1 x10E3/uL (ref 3.4–10.8)

## 2024-01-12 LAB — MICROALBUMIN / CREATININE URINE RATIO
Creatinine, Urine: 394.6 mg/dL
Microalb/Creat Ratio: 16 mg/g{creat} (ref 0–29)
Microalbumin, Urine: 62.1 ug/mL

## 2024-01-12 LAB — CMP14+EGFR
ALT: 46 IU/L — ABNORMAL HIGH (ref 0–32)
AST: 35 IU/L (ref 0–40)
Albumin: 4.5 g/dL (ref 3.9–4.9)
Alkaline Phosphatase: 107 IU/L (ref 44–121)
BUN/Creatinine Ratio: 11 — ABNORMAL LOW (ref 12–28)
BUN: 8 mg/dL (ref 8–27)
Bilirubin Total: 0.3 mg/dL (ref 0.0–1.2)
CO2: 21 mmol/L (ref 20–29)
Calcium: 9 mg/dL (ref 8.7–10.3)
Chloride: 103 mmol/L (ref 96–106)
Creatinine, Ser: 0.72 mg/dL (ref 0.57–1.00)
Globulin, Total: 2.2 g/dL (ref 1.5–4.5)
Glucose: 89 mg/dL (ref 70–99)
Potassium: 3.7 mmol/L (ref 3.5–5.2)
Sodium: 140 mmol/L (ref 134–144)
Total Protein: 6.7 g/dL (ref 6.0–8.5)
eGFR: 94 mL/min/1.73 (ref >59)

## 2024-01-12 LAB — VITAMIN B12: Vitamin B-12: 1187 pg/mL (ref 232–1245)

## 2024-01-12 LAB — MAGNESIUM: Magnesium: 2.1 mg/dL (ref 1.6–2.3)

## 2024-01-12 LAB — TSH: TSH: 1.77 u[IU]/mL (ref 0.450–4.500)

## 2024-01-13 ENCOUNTER — Ambulatory Visit: Payer: Self-pay | Admitting: Internal Medicine

## 2024-01-14 DIAGNOSIS — R002 Palpitations: Secondary | ICD-10-CM | POA: Insufficient documentation

## 2024-01-14 DIAGNOSIS — M5412 Radiculopathy, cervical region: Secondary | ICD-10-CM | POA: Insufficient documentation

## 2024-01-14 DIAGNOSIS — R400 Somnolence: Secondary | ICD-10-CM | POA: Insufficient documentation

## 2024-01-14 DIAGNOSIS — R519 Headache, unspecified: Secondary | ICD-10-CM | POA: Insufficient documentation

## 2024-01-14 NOTE — Assessment & Plan Note (Signed)
 Blood pressure is 128/80 mmHg, close to target of less than 120/80 mmHg. On valsartan 160 mg. - Continue valsartan 160 mg daily. - Encourage regular exercise. - Reminded to follow low sodium diet.

## 2024-01-14 NOTE — Assessment & Plan Note (Addendum)
 Intermittent symptoms, occur upon awakening. - Possibly related to OSA - Advised to sleep on her side.  - Stay well hydrated

## 2024-01-14 NOTE — Assessment & Plan Note (Signed)
 Numbness and tingling in left arm possibly due to muscle tightness. - Prescribe muscle relaxer at night as needed. - Encourage use of Vicks for neck tightness. - Advise on posture correction and exercises. - Consider returning to Chiro for ART/laser therapy

## 2024-01-14 NOTE — Assessment & Plan Note (Signed)
 Chronic, no med changes. She will continue with Ozempic  1mg  weekly.

## 2024-01-14 NOTE — Assessment & Plan Note (Signed)
 Symptoms suggest sleep apnea. She also reports early morning headaches and non-restorative sleep.  She also admits to falling asleep while watching TV and reading books.  No prior sleep study. - Order home-based sleep study. - Refer to neurologist.

## 2024-01-14 NOTE — Assessment & Plan Note (Addendum)
 Sporadic palpitations without chest pain or dyspnea. No prior heart monitoring. - Order 14-day heart monitor. - Unable to perform EKG, system is down - Check magnesium levels.

## 2024-01-19 ENCOUNTER — Telehealth: Payer: Self-pay | Admitting: Plastic Surgery

## 2024-01-19 ENCOUNTER — Ambulatory Visit (INDEPENDENT_AMBULATORY_CARE_PROVIDER_SITE_OTHER): Payer: Self-pay | Admitting: Plastic Surgery

## 2024-01-19 ENCOUNTER — Other Ambulatory Visit: Payer: Self-pay | Admitting: Plastic Surgery

## 2024-01-19 ENCOUNTER — Encounter: Payer: Self-pay | Admitting: Plastic Surgery

## 2024-01-19 DIAGNOSIS — D1801 Hemangioma of skin and subcutaneous tissue: Secondary | ICD-10-CM

## 2024-01-19 NOTE — Telephone Encounter (Signed)
 Dr D will be out of office and apt need to move to 1145 or move to new day

## 2024-01-19 NOTE — Progress Notes (Signed)
 Preoperative Dx: angioma of lip  Postoperative Dx:  same  Procedure: laser to lip   Anesthesia: none  Description of Procedure:  Risks and complications were explained to the patient. Consent was confirmed and signed. Eye protection was placed. Time out was called and all information was confirmed to be correct. The area  area was prepped with alcohol and wiped dry. The BBL laser was set at 560 nm at 15. The lip was lasered. The patient tolerated the procedure well and there were no complications. The patient is to follow up in 4 weeks.

## 2024-01-28 ENCOUNTER — Encounter: Payer: Self-pay | Admitting: Internal Medicine

## 2024-02-04 DIAGNOSIS — R002 Palpitations: Secondary | ICD-10-CM

## 2024-02-05 ENCOUNTER — Other Ambulatory Visit: Payer: Self-pay | Admitting: Internal Medicine

## 2024-02-05 DIAGNOSIS — R002 Palpitations: Secondary | ICD-10-CM

## 2024-02-09 ENCOUNTER — Encounter: Payer: Self-pay | Admitting: Internal Medicine

## 2024-02-12 ENCOUNTER — Ambulatory Visit: Admitting: Neurology

## 2024-02-12 ENCOUNTER — Encounter: Payer: Self-pay | Admitting: Neurology

## 2024-02-12 VITALS — BP 120/76 | HR 90 | Ht 65.0 in | Wt 200.0 lb

## 2024-02-12 DIAGNOSIS — G4763 Sleep related bruxism: Secondary | ICD-10-CM

## 2024-02-12 DIAGNOSIS — R351 Nocturia: Secondary | ICD-10-CM

## 2024-02-12 DIAGNOSIS — R0689 Other abnormalities of breathing: Secondary | ICD-10-CM

## 2024-02-12 DIAGNOSIS — Z9189 Other specified personal risk factors, not elsewhere classified: Secondary | ICD-10-CM | POA: Diagnosis not present

## 2024-02-12 DIAGNOSIS — R519 Headache, unspecified: Secondary | ICD-10-CM | POA: Diagnosis not present

## 2024-02-12 DIAGNOSIS — E66811 Obesity, class 1: Secondary | ICD-10-CM | POA: Diagnosis not present

## 2024-02-12 NOTE — Progress Notes (Signed)
 Subjective:    Patient ID: Nichole George is a 63 y.o. female.  HPI    True Mar, MD, PhD [facility] [address], [address] [address] [place], [place] [address]  Dear Dr. Jarold,  I saw your patient, Nichole George, upon your kind request in my sleep clinic today for initial consultation of her sleep disorder, in particular, concern for underlying obstructive sleep apnea.  The patient is unaccompanied today.  As you know, Nichole George is a 63 year old female with an underlying medical history of palpitations, hyperlipidemia, hypertension, cervical radiculopathy, headaches, and obesity, who reports recurrent morning headaches for the past several months.  Her Epworth sleepiness score is 0 out of 24, fatigue severity score is 9 out of 63.  I reviewed your office note from 01/11/2024. She is not aware of any snoring, no one has complained about it.  She is single and lives alone.  Her son currently staying with her.  She has 3 grown children.  She has no pets in the household.  She does not always wake up rested and has neck discomfort, tries to sleep on her sides, she has woken up at least once with a sense of gasping for air.  She has a TV in her bedroom and typically watches TV while sitting in a chair and then goes to bed between 10 and midnight.  Rise time is around 5.  She has occasional nocturia and nearly daily morning headaches.  She has no daily caffeine.  She drinks alcohol occasionally, less than once a week.  She is a non-smoker.  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 She had a tonsillectomy as a toddler.  She wears a bite guard for grinding.  Her Past Medical History Is Significant For: Past Medical History:  Diagnosis Date   Arthritis    generalized   Cervical dysplasia, moderate    Hyperlipidemia    on meds   Hypertension    on meds   Osteoporosis    Type 2 diabetes mellitus (HCC)    diet  and exercise controlled    Her Past  Surgical History Is Significant For: Past Surgical History:  Procedure Laterality Date   BREAST CYST ASPIRATION Left    CERVICAL CONIZATION W/BX N/A 04/19/2018   Procedure: CONIZATION CERVIX WITH BIOPSY;  Surgeon: Raeanne Shanda SQUIBB, MD;  Location: [facility];  Service: Gynecology;  Laterality: N/A;   CESAREAN SECTION     CHOLECYSTECTOMY     TUBAL LIGATION      Her Family History Is Significant For: Family History  Problem Relation Age of Onset   Healthy Mother    Pneumonia Father    Breast cancer Sister 86   Colon cancer Maternal Aunt 69   Cancer Other    Hypertension Other    Hyperlipidemia Other    Colon polyps Neg Hx    Esophageal cancer Neg Hx    Rectal cancer Neg Hx    Stomach cancer Neg Hx    Sleep apnea Neg Hx     Her Social History Is Significant For: Social History   Socioeconomic History   Marital status: Single    Spouse name: Not on file   Number of children: Not on file   Years of education: Not on file   Highest education level: Not on file  Occupational History   Not on file  Tobacco Use   Smoking status: Never   Smokeless tobacco: Never  Vaping Use  Vaping status: Never Used  Substance and Sexual Activity   Alcohol use: Not Currently    Comment: occ wine   Drug use: Never   Sexual activity: Not on file  Other Topics Concern   Not on file  Social History Narrative   Pt lives with family    Pt works    Social Drivers of Corporate investment banker Strain: Not on file  Food Insecurity: Not on file  Transportation Needs: Not on file  Physical Activity: Not on file  Stress: Not on file  Social Connections: Not on file    Her Allergies Are:  No Known Allergies:   Her Current Medications Are:  Outpatient Encounter Medications as of 02/12/2024  Medication Sig   Ascorbic Acid (VITAMIN C) POWD Take by mouth. 1/2 tsp daily    aspirin 81 MG chewable tablet Monday and Wednesday and Friday   atorvastatin  (LIPITOR) 40 MG  tablet TAKE 1 TABLET DAILY MONDAY THROUGH SATURDAY, SKIP SUNDAYS   Cholecalciferol (VITAMIN D3) 50 MCG (2000 UT) capsule Take 5,000 Units by mouth daily.    clobetasol cream (TEMOVATE) 0.05 % 1 Application.   Coenzyme Q10 10 MG capsule    fluticasone  (FLONASE ) 50 MCG/ACT nasal spray SPRAY ONE SPRAY INTO EACH NOSTRIL DAILY   loratadine  (CLARITIN ) 10 MG tablet Take 1 tablet (10 mg total) by mouth daily.   MAGNESIUM PO    PSYLLIUM HUSK PO Take 1 Dose by mouth daily at 6 (six) AM.   Semaglutide , 1 MG/DOSE, (OZEMPIC , 1 MG/DOSE,) 4 MG/3ML SOPN INJECT 1 MG INTO THE SKIN ONE TIME PER WEEK   triamcinolone  cream (KENALOG ) 0.1 % APPLY DAILY TO SKIN TO AFFECTED AREA EVERY DAY FOR 30 DAYS 14   valsartan  (DIOVAN ) 160 MG tablet TAKE 1 TABLET BY MOUTH EVERY DAY   Blood Glucose Monitoring Suppl KIT Use to check blood sugar 3 times daily. Dx code: e11.65 Dispense based on plan coverage with supplies.   Continuous Blood Gluc Receiver (DEXCOM G6 RECEIVER) DEVI USE TO CHECK BLOOD SUGAR 3 TIMES DAILY. DX CODE: E11.65 (Patient not taking: Reported on 02/12/2024)   Continuous Blood Gluc Transmit (DEXCOM G6 TRANSMITTER) MISC Use to check blood sugar 3 times daily. Dx code: e11.65 (Patient not taking: Reported on 02/12/2024)   cyclobenzaprine  (FLEXERIL ) 5 MG tablet One tab po qp as needed (Patient not taking: Reported on 02/12/2024)   Insulin Pen Needle (PEN NEEDLES) 32G X 4 MM MISC Use as directed with insulin   No facility-administered encounter medications on file as of 02/12/2024.  :   Review of Systems:  Out of a complete 14 point review of systems, all are reviewed and negative with the exception of these symptoms as listed below:  Review of Systems  Neurological:        Pt here for sleep consult Pt has headaches,hypertension Pt denies sleep study,cpap machine,snoring,fatigue   Ess:0 Fss:9    Objective:  Neurological Exam  Physical Exam Physical Examination:   Vitals:   02/12/24 0907  BP: 120/76   Pulse: 90    General Examination: The patient is a very pleasant 63 y.o. female in no acute distress. She appears well-developed and well-nourished and well groomed.   HEENT: Normocephalic, atraumatic, pupils are equal, round and reactive to light, extraocular tracking is good without limitation to gaze excursion or nystagmus noted. Hearing is grossly intact. Face is symmetric with normal facial animation. Speech is clear with no dysarthria noted. There is no hypophonia. There is no lip,  neck/head, jaw or voice tremor. Neck is supple with full range of passive and active motion. There are no carotid bruits on auscultation. Oropharynx exam reveals: mild mouth dryness, adequate dental hygiene and mild airway crowding, due to small airway entry, Mallampati class II, tonsils absent, thicker tongue noted.  Tongue protrudes centrally and palate elevates symmetrically, mild overbite noted.  Chest: Clear to auscultation without wheezing, rhonchi or crackles noted.  Heart: S1+S2+0, regular and normal without murmurs, rubs or gallops noted.   Abdomen: Soft, non-tender and non-distended.  Extremities: There is no pitting edema in the distal lower extremities bilaterally.   Skin: Warm and dry without trophic changes noted.   Musculoskeletal: exam reveals no obvious joint deformities.   Neurologically:  Mental status: The patient is awake, alert and oriented in all 4 spheres. Her immediate and remote memory, attention, language skills and fund of knowledge are appropriate. There is no evidence of aphasia, agnosia, apraxia or anomia. Speech is clear with normal prosody and enunciation. Thought process is linear. Mood is normal and affect is normal.  Cranial nerves II - XII are as described above under HEENT exam.  Motor exam: Normal bulk, strength and tone is noted. There is no obvious action or resting tremor.  Fine motor skills and coordination: grossly intact.  Cerebellar testing: No dysmetria or  intention tremor. There is no truncal or gait ataxia.  Sensory exam: intact to light touch in the upper and lower extremities.  Gait, station and balance: She stands easily. No veering to one side is noted. No leaning to one side is noted. Posture is age-appropriate and stance is narrow based. Gait shows normal stride length and normal pace. No problems turning are noted.   Assessment and plan:  In summary, Geetika Laborde is a very pleasant 63 y.o.-year old female with an underlying medical history of palpitations, hyperlipidemia, hypertension, cervical radiculopathy, headaches, and obesity, whose history and physical exam are concerning for sleep disordered breathing, particularly obstructive sleep apnea (OSA). A laboratory attended sleep study is typically considered gold standard for evaluation of sleep disordered breathing.   I had a long chat with the patient about my findings and the diagnosis of sleep apnea, particularly OSA, its prognosis and treatment options. We talked about medical/conservative treatments, surgical interventions and non-pharmacological approaches for symptom control. I explained, in particular, the risks and ramifications of untreated moderate to severe OSA, especially with respect to developing cardiovascular disease down the road, including congestive heart failure (CHF), difficult to treat hypertension, cardiac arrhythmias (particularly A-fib), neurovascular complications including TIA, stroke and dementia. Even type 2 diabetes has, in part, been linked to untreated OSA. Symptoms of untreated OSA may include (but may not be limited to) daytime sleepiness, nocturia (i.e. frequent nighttime urination), memory problems, mood irritability and suboptimally controlled or worsening mood disorder such as depression and/or anxiety, lack of energy, lack of motivation, physical discomfort, as well as recurrent headaches, especially morning or nocturnal headaches. We talked about the  importance of maintaining a healthy lifestyle and striving for healthy weight. In addition, we talked about the importance of striving for and maintaining good sleep hygiene.  In particular, she is encouraged to make enough time for sleep so she can get at least 7 to 8 hours of sleep in any given night. I recommended a sleep study at this time. I outlined the differences between a laboratory attended sleep study which is considered more comprehensive and accurate over the option of a home sleep test (HST); the latter  may lead to underestimation of sleep disordered breathing in some instances and does not help with diagnosing upper airway resistance syndrome and is not accurate enough to diagnose primary central sleep apnea typically. I outlined possible surgical and non-surgical treatment options of OSA, including the use of a positive airway pressure (PAP) device (i.e. CPAP, AutoPAP/APAP or BiPAP in certain circumstances), a custom-made dental device (aka oral appliance, which would require a referral to a specialist dentist or orthodontist typically, and is generally speaking not considered for patients with full dentures or edentulous state), upper airway surgical options, such as traditional UPPP (which is not considered a first-line treatment) or the Inspire device (hypoglossal nerve stimulator, which would involve a referral for consultation with an ENT surgeon, after careful selection, following inclusion criteria - also not first-line treatment). I explained the PAP treatment option to the patient in detail, as this is generally considered first-line treatment.  The patient indicated that she would be willing to try PAP therapy, if the need arises. I explained the importance of being compliant with PAP treatment, not only for insurance purposes but primarily to improve patient's symptoms symptoms, and for the patient's long term health benefit, including to reduce Her cardiovascular risks longer-term.     We will pick up our discussion about the next steps and treatment options after testing.  We will keep her posted as to the test results by phone call and/or MyChart messaging where possible.  We will plan to follow-up in sleep clinic accordingly as well.  I answered all her questions today and the patient was in agreement.   I encouraged her to call with any interim questions, concerns, problems or updates or email us  through MyChart.  Generally speaking, sleep test authorizations may take up to 2 weeks, sometimes less, sometimes longer, the patient is encouraged to get in touch with us  if they do not hear back from the sleep lab staff directly within the next 2 weeks.  Thank you very much for allowing me to participate in the care of this nice patient. If I can be of any further assistance to you please do not hesitate to call me at [phone].  Sincerely,   True Mar, MD, PhD

## 2024-02-12 NOTE — Patient Instructions (Signed)

## 2024-02-13 ENCOUNTER — Telehealth: Payer: Self-pay | Admitting: Neurology

## 2024-02-13 NOTE — Telephone Encounter (Signed)
 LVM for pt to call back to schedule EE  NPSG & HST aetna state no auth req EE

## 2024-02-15 NOTE — Telephone Encounter (Signed)
 NPSG [employer] state no auth req.  Patient is scheduled at [facility] for 03/11/24 at 9 pm.  Mailed packet and sent mychart.

## 2024-02-19 NOTE — Progress Notes (Signed)
 I,[name], CMA,acting as a Neurosurgeon for Nichole LOISE Slocumb, MD.,have documented all relevant documentation on the behalf of Nichole LOISE Slocumb, MD,as directed by  Nichole LOISE Slocumb, MD while in the presence of Nichole LOISE Slocumb, MD.  Subjective:    Patient ID: Nichole George , female    DOB: Jan 17, 1961 , 63 y.o.   MRN: [id number]  Chief Complaint  Patient presents with   Annual Exam    Patient presents today for annual exam. She reports compliance with medications. Denies headache, chest pain & sob. GYN: Dr HENRY   Diabetes   Hypertension    HPI Discussed the use of AI scribe software for clinical note transcription with the patient, who gave verbal consent to proceed.  History of Present Illness Nichole George is a 63 year old female with diabetes and hypertension who presents for a physical exam, diabetes and blood pressure check.  Her blood sugar was 116 mg/dL this morning. She is currently taking semaglutide  1 mg weekly for diabetes management. She has been trying to resume her exercise routine, aiming for three to four days a week, but was previously less active due to the heat. She is mindful of her diet, avoiding sodas and juices, and dilutes any juice she consumes with water.  She is on atorvastatin  40 mg Monday through Saturday for cholesterol management and takes valsartan  160 mg daily for blood pressure control. She had a CT scan in 2017, and the doctor informed her that it showed fatty liver and plaque in the aorta. She is fasting today for a cholesterol test.  In terms of physical activity, she incorporates stretches and exercises into her work routine, including using a yoga strap, doing squats, and walking stairs. She drinks water with lemon, tea, and occasionally coffee with creamer.   Diabetes She presents for her initial diabetic visit. She has type 2 diabetes mellitus. The initial diagnosis of diabetes was made 4 weeks ago. Pertinent negatives for diabetes include no  blurred vision and no chest pain. There are no hypoglycemic complications. She participates in exercise daily. An ACE inhibitor/angiotensin II receptor blocker is being taken. Eye exam is current.  Hypertension This is a chronic problem. The current episode started more than 1 year ago. The problem has been gradually improving since onset. The problem is controlled. Pertinent negatives include no blurred vision, chest pain or palpitations. Risk factors for coronary artery disease include diabetes mellitus, dyslipidemia and post-menopausal state. Past treatments include angiotensin blockers. The current treatment provides moderate improvement.     Past Medical History:  Diagnosis Date   Arthritis    generalized   Cervical dysplasia, moderate    Hyperlipidemia    on meds   Hypertension    on meds   Osteoporosis    Type 2 diabetes mellitus (HCC)    diet  and exercise controlled     Family History  Problem Relation Age of Onset   Healthy Mother    Pneumonia Father    Breast cancer Sister 43   Colon cancer Maternal Aunt 2   Cancer Other    Hypertension Other    Hyperlipidemia Other    Colon polyps Neg Hx    Esophageal cancer Neg Hx    Rectal cancer Neg Hx    Stomach cancer Neg Hx    Sleep apnea Neg Hx      Current Outpatient Medications:    Ascorbic Acid (VITAMIN C) POWD, Take by mouth. 1/2 tsp daily , Disp: , Rfl:  aspirin 81 MG chewable tablet, Monday and Wednesday and Friday, Disp: , Rfl:    atorvastatin  (LIPITOR) 40 MG tablet, TAKE 1 TABLET DAILY MONDAY THROUGH SATURDAY, SKIP SUNDAYS, Disp: 90 tablet, Rfl: 1   Blood Glucose Monitoring Suppl KIT, Use to check blood sugar 3 times daily. Dx code: e11.65 Dispense based on plan coverage with supplies., Disp: 1 kit, Rfl: 10   Cholecalciferol (VITAMIN D3) 50 MCG (2000 UT) capsule, Take 5,000 Units by mouth daily. , Disp: , Rfl:    clobetasol cream (TEMOVATE) 0.05 %, 1 Application., Disp: , Rfl:    Coenzyme Q10 10 MG capsule, ,  Disp: , Rfl:    Continuous Blood Gluc Receiver (DEXCOM G6 RECEIVER) DEVI, USE TO CHECK BLOOD SUGAR 3 TIMES DAILY. DX CODE: E11.65 (Patient not taking: Reported on 02/12/2024), Disp: 1 each, Rfl: 0   Continuous Blood Gluc Transmit (DEXCOM G6 TRANSMITTER) MISC, Use to check blood sugar 3 times daily. Dx code: e11.65 (Patient not taking: Reported on 02/12/2024), Disp: 1 each, Rfl: 10   cyclobenzaprine  (FLEXERIL ) 5 MG tablet, One tab po qp as needed (Patient not taking: Reported on 02/12/2024), Disp: 30 tablet, Rfl: 0   fluticasone  (FLONASE ) 50 MCG/ACT nasal spray, SPRAY ONE SPRAY INTO EACH NOSTRIL DAILY, Disp: 48 mL, Rfl: 1   Insulin Pen Needle (PEN NEEDLES) 32G X 4 MM MISC, Use as directed with insulin, Disp: 100 each, Rfl: 2   loratadine  (CLARITIN ) 10 MG tablet, Take 1 tablet (10 mg total) by mouth daily., Disp: 90 tablet, Rfl: 1   MAGNESIUM PO, , Disp: , Rfl:    PSYLLIUM HUSK PO, Take 1 Dose by mouth daily at 6 (six) AM., Disp: , Rfl:    Semaglutide , 1 MG/DOSE, (OZEMPIC , 1 MG/DOSE,) 4 MG/3ML SOPN, INJECT 1 MG INTO THE SKIN ONE TIME PER WEEK, Disp: 3 mL, Rfl: 5   triamcinolone  cream (KENALOG ) 0.1 %, APPLY DAILY TO SKIN TO AFFECTED AREA EVERY DAY FOR 30 DAYS 14, Disp: 30 g, Rfl: 1   valsartan  (DIOVAN ) 160 MG tablet, TAKE 1 TABLET BY MOUTH EVERY DAY, Disp: 90 tablet, Rfl: 2   No Known Allergies    The patient states she uses post menopausal status for birth control. No LMP recorded. Patient is postmenopausal.. Negative for Dysmenorrhea. Negative for: breast discharge, breast lump(s), breast pain and breast self exam. Associated symptoms include abnormal vaginal bleeding. Pertinent negatives include abnormal bleeding (hematology), anxiety, decreased libido, depression, difficulty falling sleep, dyspareunia, history of infertility, nocturia, sexual dysfunction, sleep disturbances, urinary incontinence, urinary urgency, vaginal discharge and vaginal itching. Diet regular.The patient states her exercise level  is  moderate.   . The patient's tobacco use is:  Social History   Tobacco Use  Smoking Status Never  Smokeless Tobacco Never  . She has been exposed to passive smoke. The patient's alcohol use is:  Social History   Substance and Sexual Activity  Alcohol Use Not Currently   Comment: occ wine    Review of Systems  Constitutional: Negative.   HENT: Negative.    Eyes: Negative.  Negative for blurred vision.  Respiratory: Negative.    Cardiovascular: Negative.  Negative for chest pain and palpitations.  Gastrointestinal: Negative.   Endocrine: Negative.   Genitourinary: Negative.   Musculoskeletal: Negative.   Skin: Negative.   Allergic/Immunologic: Negative.   Neurological: Negative.   Hematological: Negative.   Psychiatric/Behavioral: Negative.       Today's Vitals   02/20/24 0844  BP: 110/80  Pulse: 84  Temp: 98.1 F (  36.7 C)  SpO2: 98%  Weight: 201 lb 3.2 oz (91.3 kg)  Height: 5' 5 (1.651 m)   Body mass index is 33.48 kg/m.  Wt Readings from Last 3 Encounters:  02/20/24 201 lb 3.2 oz (91.3 kg)  02/12/24 200 lb (90.7 kg)  01/11/24 203 lb 9.6 oz (92.4 kg)     Objective:  Physical Exam Vitals and nursing note reviewed.  Constitutional:      Appearance: Normal appearance. She is obese.  HENT:     Head: Normocephalic and atraumatic.     Right Ear: Tympanic membrane, ear canal and external ear normal.     Left Ear: Tympanic membrane, ear canal and external ear normal.     Nose: Nose normal.     Mouth/Throat:     Mouth: Mucous membranes are moist.     Pharynx: Oropharynx is clear.  Eyes:     Extraocular Movements: Extraocular movements intact.     Conjunctiva/sclera: Conjunctivae normal.     Pupils: Pupils are equal, round, and reactive to light.  Cardiovascular:     Rate and Rhythm: Normal rate and regular rhythm.     Pulses: Normal pulses.          Dorsalis pedis pulses are 2+ on the right side and 2+ on the left side.     Heart sounds: Normal heart  sounds.  Pulmonary:     Effort: Pulmonary effort is normal.     Breath sounds: Normal breath sounds.  Chest:  Breasts:    Tanner Score is 5.     Right: Normal.     Left: Normal.  Abdominal:     General: Abdomen is flat. Bowel sounds are normal.     Palpations: Abdomen is soft.  Genitourinary:    Comments: deferred Musculoskeletal:        General: Normal range of motion.     Cervical back: Normal range of motion and neck supple.  Feet:     Right foot:     Protective Sensation: 5 sites tested.  5 sites sensed.     Skin integrity: Skin integrity normal.     Toenail Condition: Right toenails are normal.     Left foot:     Protective Sensation: 5 sites tested.  5 sites sensed.     Skin integrity: Skin integrity normal.     Toenail Condition: Left toenails are normal.  Skin:    General: Skin is warm and dry.  Neurological:     General: No focal deficit present.     Mental Status: She is alert and oriented to person, place, and time.  Psychiatric:        Mood and Affect: Mood normal.        Behavior: Behavior normal.         Assessment And Plan:     Routine general medical examination at health care facility Assessment & Plan: A full exam was performed. Importance of monthly self breast exams was discussed with the patient. PATIENT IS ADVISED TO GET 30-45 MINUTES REGULAR EXERCISE NO LESS THAN FOUR TO FIVE DAYS PER WEEK - BOTH WEIGHTBEARING EXERCISES AND AEROBIC ARE RECOMMENDED.  PATIENT IS ADVISED TO FOLLOW A HEALTHY DIET WITH AT LEAST SIX FRUITS/VEGGIES PER DAY, DECREASE INTAKE OF RED MEAT, AND TO INCREASE FISH INTAKE TO TWO DAYS PER WEEK.  MEATS/FISH SHOULD NOT BE FRIED, BAKED OR BROILED IS PREFERABLE.  IT IS ALSO IMPORTANT TO CUT BACK ON YOUR SUGAR INTAKE. PLEASE AVOID ANYTHING WITH ADDED  SUGAR, CORN SYRUP OR OTHER SWEETENERS. IF YOU MUST USE A SWEETENER, YOU CAN TRY STEVIA. IT IS ALSO IMPORTANT TO AVOID ARTIFICIALLY SWEETENERS AND DIET BEVERAGES. LASTLY, I SUGGEST WEARING SPF  50 SUNSCREEN ON EXPOSED PARTS AND ESPECIALLY WHEN IN THE DIRECT SUNLIGHT FOR AN EXTENDED PERIOD OF TIME.  PLEASE AVOID FAST FOOD RESTAURANTS AND INCREASE YOUR WATER INTAKE.   Orders: -     Lipid panel  Dyslipidemia associated with type 2 diabetes mellitus (HCC) Assessment & Plan: Chronic, diabetic foot exam was performed . LDL goal is less than 70, currently on statin therapy. On semaglutide  1 mg weekly. Blood sugar 116 mg/dL indicates good control. - Continue semaglutide  1 mg weekly. - Order hemoglobin A1c test. - Cholesterol is managed with atorvastatin  40 mg Monday through Saturday.  - Continue atorvastatin  40 mg Monday through Saturday. - Order cholesterol panel.  Orders: -     Hemoglobin A1c -     POCT URINALYSIS DIP (CLINITEK) -     Microalbumin / creatinine urine ratio  Hypertensive heart disease without heart failure Assessment & Plan: Blood pressure is 110/80 mmHg, close to target of less than 120/80 mmHg. On valsartan  160 mg.  EKG performed, NSR w/o acute changes.  - Continue valsartan  160 mg daily. - Encourage regular exercise. - Reminded to follow low sodium diet.   Orders: -     EKG 12-Lead  Aortic atherosclerosis (HCC) Assessment & Plan: Incidental finding on CT abd/pelvis in 2017.  - Continue with ASA and statin therapy   Hepatic steatosis Assessment & Plan: Identified on CT in 2017. Elevated ALT last month. Dietary modifications recommended. - Order ALT test. - Decrease sugar intake - Limit Tylenol and alcohol use - Hallmark of treatment: lifestyle modifications, including weight loss, increased physical activity, and a healthy diet.   Orders: -     ALT  Class 1 obesity due to excess calories with serious comorbidity and body mass index (BMI) of 33.0 to 33.9 in adult Assessment & Plan: She is encouraged to strive for BMI less than 30 to decrease cardiac risk. Advised to aim for at least 150 minutes of exercise per week.     Return in 10 weeks (on  04/30/2024), or [place] - flu vaccine, for 1 year HM, 4 month dm f/u.SABRA Patient was given opportunity to ask questions. Patient verbalized understanding of the plan and was able to repeat key elements of the plan. All questions were answered to their satisfaction.   I, Nichole LOISE Slocumb, MD, have reviewed all documentation for this visit. The documentation on 02/20/24 for the exam, diagnosis, procedures, and orders are all accurate and complete.

## 2024-02-19 NOTE — Patient Instructions (Signed)

## 2024-02-20 ENCOUNTER — Encounter: Payer: Self-pay | Admitting: Internal Medicine

## 2024-02-20 ENCOUNTER — Ambulatory Visit: Payer: Self-pay | Admitting: Internal Medicine

## 2024-02-20 VITALS — BP 110/80 | HR 84 | Temp 98.1°F | Ht 65.0 in | Wt 201.2 lb

## 2024-02-20 DIAGNOSIS — I700 Atherosclerosis of aorta: Secondary | ICD-10-CM | POA: Diagnosis not present

## 2024-02-20 DIAGNOSIS — I119 Hypertensive heart disease without heart failure: Secondary | ICD-10-CM

## 2024-02-20 DIAGNOSIS — E785 Hyperlipidemia, unspecified: Secondary | ICD-10-CM | POA: Diagnosis not present

## 2024-02-20 DIAGNOSIS — R748 Abnormal levels of other serum enzymes: Secondary | ICD-10-CM

## 2024-02-20 DIAGNOSIS — E6609 Other obesity due to excess calories: Secondary | ICD-10-CM

## 2024-02-20 DIAGNOSIS — Z6833 Body mass index (BMI) 33.0-33.9, adult: Secondary | ICD-10-CM

## 2024-02-20 DIAGNOSIS — I10 Essential (primary) hypertension: Secondary | ICD-10-CM

## 2024-02-20 DIAGNOSIS — K760 Fatty (change of) liver, not elsewhere classified: Secondary | ICD-10-CM

## 2024-02-20 DIAGNOSIS — E66811 Obesity, class 1: Secondary | ICD-10-CM

## 2024-02-20 DIAGNOSIS — Z0000 Encounter for general adult medical examination without abnormal findings: Secondary | ICD-10-CM | POA: Diagnosis not present

## 2024-02-20 DIAGNOSIS — E1169 Type 2 diabetes mellitus with other specified complication: Secondary | ICD-10-CM | POA: Diagnosis not present

## 2024-02-20 LAB — POCT URINALYSIS DIP (CLINITEK)
Blood, UA: NEGATIVE
Glucose, UA: NEGATIVE mg/dL
Leukocytes, UA: NEGATIVE
Nitrite, UA: NEGATIVE
POC PROTEIN,UA: 30 — AB
Spec Grav, UA: 1.02 (ref 1.010–1.025)
Urobilinogen, UA: 0.2 U/dL
pH, UA: 6 (ref 5.0–8.0)

## 2024-02-21 ENCOUNTER — Encounter: Payer: Self-pay | Admitting: Internal Medicine

## 2024-02-22 ENCOUNTER — Other Ambulatory Visit: Payer: Self-pay | Admitting: Internal Medicine

## 2024-02-22 DIAGNOSIS — R8290 Unspecified abnormal findings in urine: Secondary | ICD-10-CM

## 2024-02-22 LAB — HEMOGLOBIN A1C
Est. average glucose Bld gHb Est-mCnc: 126 mg/dL
Hgb A1c MFr Bld: 6 % — ABNORMAL HIGH (ref 4.8–5.6)

## 2024-02-22 LAB — LIPID PANEL
Chol/HDL Ratio: 2.6 ratio (ref 0.0–4.4)
Cholesterol, Total: 151 mg/dL (ref 100–199)
HDL: 59 mg/dL (ref >39)
LDL Chol Calc (NIH): 77 mg/dL (ref 0–99)
Triglycerides: 81 mg/dL (ref 0–149)
VLDL Cholesterol Cal: 15 mg/dL (ref 5–40)

## 2024-02-22 LAB — ALT: ALT: 29 IU/L (ref 0–32)

## 2024-02-22 LAB — MICROALBUMIN / CREATININE URINE RATIO
Creatinine, Urine: 392.8 mg/dL
Microalb/Creat Ratio: 7 mg/g{creat} (ref 0–29)
Microalbumin, Urine: 27.7 ug/mL

## 2024-02-24 DIAGNOSIS — K760 Fatty (change of) liver, not elsewhere classified: Secondary | ICD-10-CM | POA: Insufficient documentation

## 2024-02-24 DIAGNOSIS — I700 Atherosclerosis of aorta: Secondary | ICD-10-CM | POA: Insufficient documentation

## 2024-02-24 NOTE — Assessment & Plan Note (Addendum)
 Identified on CT in 2017. Elevated ALT last month. Dietary modifications recommended. - Order ALT test. - Decrease sugar intake - Limit Tylenol and alcohol use - Hallmark of treatment: lifestyle modifications, including weight loss, increased physical activity, and a healthy diet.

## 2024-02-24 NOTE — Assessment & Plan Note (Signed)
 She is encouraged to strive for BMI less than 30 to decrease cardiac risk. Advised to aim for at least 150 minutes of exercise per week.

## 2024-02-24 NOTE — Assessment & Plan Note (Signed)
 Incidental finding on CT abd/pelvis in 2017.  - Continue with ASA and statin therapy

## 2024-02-24 NOTE — Assessment & Plan Note (Signed)

## 2024-02-24 NOTE — Assessment & Plan Note (Signed)
 Blood pressure is 110/80 mmHg, close to target of less than 120/80 mmHg. On valsartan  160 mg.  EKG performed, NSR w/o acute changes.  - Continue valsartan  160 mg daily. - Encourage regular exercise. - Reminded to follow low sodium diet.

## 2024-02-24 NOTE — Assessment & Plan Note (Signed)
 Chronic, diabetic foot exam was performed . LDL goal is less than 70, currently on statin therapy. On semaglutide  1 mg weekly. Blood sugar 116 mg/dL indicates good control. - Continue semaglutide  1 mg weekly. - Order hemoglobin A1c test. - Cholesterol is managed with atorvastatin  40 mg Monday through Saturday.  - Continue atorvastatin  40 mg Monday through Saturday. - Order cholesterol panel.

## 2024-02-26 ENCOUNTER — Ambulatory Visit: Payer: Self-pay | Admitting: Internal Medicine

## 2024-03-06 ENCOUNTER — Other Ambulatory Visit: Payer: Self-pay | Admitting: Internal Medicine

## 2024-03-08 ENCOUNTER — Ambulatory Visit (INDEPENDENT_AMBULATORY_CARE_PROVIDER_SITE_OTHER): Payer: Self-pay | Admitting: Plastic Surgery

## 2024-03-08 DIAGNOSIS — D1801 Hemangioma of skin and subcutaneous tissue: Secondary | ICD-10-CM

## 2024-03-08 NOTE — Progress Notes (Signed)
 Preoperative Dx: angioma of lip  Postoperative Dx:  same  Procedure: laser to lip angioma 1 cm   Anesthesia: none  Description of Procedure:  Risks and complications were explained to the patient. Consent was confirmed and signed. Eye protection was placed. Time out was called and all information was confirmed to be correct. The area  area was prepped with alcohol and wiped dry. The Heroic laser was set at 590 nm and preset J/cm2. The lip was lasered. The patient tolerated the procedure well and there were no complications. The patient is to follow up in 4 weeks.

## 2024-03-11 ENCOUNTER — Encounter: Payer: Self-pay | Admitting: Internal Medicine

## 2024-03-11 ENCOUNTER — Encounter

## 2024-03-12 ENCOUNTER — Encounter: Payer: Self-pay | Admitting: Internal Medicine

## 2024-03-13 NOTE — Telephone Encounter (Signed)
 I spoke with the patient to r/s her SS.  [employer] state no auth req r/s from 03/11/24 due system was down   Sent a [employer] and mailed packet.

## 2024-03-18 ENCOUNTER — Ambulatory Visit
Admission: RE | Admit: 2024-03-18 | Discharge: 2024-03-18 | Disposition: A | Discharge status: Home / Self Care | Payer: BC Managed Care – PPO | Source: Ambulatory Visit | Attending: Internal Medicine | Admitting: Internal Medicine

## 2024-03-18 DIAGNOSIS — Z1231 Encounter for screening mammogram for malignant neoplasm of breast: Secondary | ICD-10-CM

## 2024-04-08 ENCOUNTER — Ambulatory Visit: Attending: Internal Medicine | Admitting: Internal Medicine

## 2024-04-08 ENCOUNTER — Encounter: Payer: Self-pay | Admitting: Internal Medicine

## 2024-04-08 VITALS — BP 110/70 | HR 79 | Resp 16 | Ht 65.0 in | Wt 195.0 lb

## 2024-04-08 DIAGNOSIS — I10 Essential (primary) hypertension: Secondary | ICD-10-CM

## 2024-04-08 DIAGNOSIS — R42 Dizziness and giddiness: Secondary | ICD-10-CM

## 2024-04-08 DIAGNOSIS — R002 Palpitations: Secondary | ICD-10-CM | POA: Diagnosis not present

## 2024-04-08 DIAGNOSIS — E1169 Type 2 diabetes mellitus with other specified complication: Secondary | ICD-10-CM | POA: Diagnosis not present

## 2024-04-08 DIAGNOSIS — E785 Hyperlipidemia, unspecified: Secondary | ICD-10-CM

## 2024-04-08 NOTE — Patient Instructions (Signed)
 Medication Instructions:  Continue same medications   Lab Work: None ordered  Testing/Procedures: None ordered  Follow-Up: At [facility], you and your health needs are our priority.  As part of our continuing mission to provide you with exceptional heart care, our providers are all part of one team.  This team includes your primary Cardiologist (physician) and Advanced Practice Providers or APPs (Physician Assistants and Nurse Practitioners) who all work together to provide you with the care you need, when you need it.  Your next appointment:  Follow up as needed    Provider:  Dr.Segal   We recommend signing up for the patient portal called MyChart.  Sign up information is provided on this After Visit Summary.  MyChart is used to connect with patients for Virtual Visits (Telemedicine).  Patients are able to view lab/test results, encounter notes, upcoming appointments, etc.  Non-urgent messages can be sent to your provider as well.   To learn more about what you can do with MyChart, go to [email or web address].

## 2024-04-08 NOTE — Progress Notes (Signed)
 Cardiology Office Note   Date:  04/08/2024  ID:  Nichole George, DOB 16-Jan-1961, MRN [id number] PCP: Jarold Medici, MD  [facility] Providers Cardiologist:  Emeline FORBES Calender, MD     History of Present Illness Nichole George is a 63 y.o. female with a past medical history of type 2 diabetes, dyslipidemia, hypertension, obesity who was referred by her PCP for palpitations.  She underwent 3-day Holter monitoring and was referred to cardiology for further assessment.  She gets palpitations on occasion which typically happen in the morning and last up to a minute.  She does not get presyncopal or syncopal symptoms.  Does not happen while she exercises.  She does not have any exertional symptoms.  She limits her caffeine intake but does have a couple coffee or tea on occasion.  A 3-day monitor did not show any concerning arrhythmias.  She has a sleep study scheduled for this week.  Of note, today she feels a bit dizzy since last night.  Does not have any issues going from sitting to standing.  Repeat blood pressure in the office today was normal.  Her blood sugars are relatively well-controlled but she did not check today.  She did eat breakfast.   ROS:  Review of Systems  All other systems reviewed and are negative.   Physical Exam  Physical Exam Vitals and nursing note reviewed.  Constitutional:      Appearance: Normal appearance.  HENT:     Head: Normocephalic and atraumatic.  Eyes:     Conjunctiva/sclera: Conjunctivae normal.  Neck:     Vascular: No carotid bruit.  Cardiovascular:     Rate and Rhythm: Normal rate and regular rhythm.  Pulmonary:     Effort: Pulmonary effort is normal.     Breath sounds: Normal breath sounds.  Musculoskeletal:        General: No swelling or tenderness.  Skin:    Coloration: Skin is not jaundiced or pale.  Neurological:     Mental Status: She is alert.     VS:  BP 110/70   Pulse 79   Resp 16   Ht 5' 5 (1.651 m)   Wt 195 lb (88.5  kg)   SpO2 98%   BMI 32.45 kg/m         Wt Readings from Last 3 Encounters:  04/08/24 195 lb (88.5 kg)  02/20/24 201 lb 3.2 oz (91.3 kg)  02/12/24 200 lb (90.7 kg)     EKG Interpretation Date/Time:    Ventricular Rate:    PR Interval:    QRS Duration:    QT Interval:    QTC Calculation:   R Axis:      Text Interpretation:      Studies Reviewed   Zio patch  Patch Wear Time:  3 days and 8 hours (2025-07-24T07:39:06-399 to 2025-07-27T16:08:23-0400)  HR 57 - 134, average 88 bpm. 1 nonsustained SVT (longest 5 beats). No atrial fibrillation detected. Rare supraventricular ectopy. Rare ventricular ectopy. No sustained arrhythmias. Symptom trigger episodes correspond to sinus rhythm and sinus with ectopy.       Risk Assessment/Calculations             ASCVD risk score: The 10-year ASCVD risk score (Arnett DK, et al., 2019) is: 10.5%   Values used to calculate the score:     Age: 3 years     Clincally relevant sex: Female     Is Non-Hispanic African American: Yes     Diabetic: Yes  Tobacco smoker: No     Systolic Blood Pressure: 110 mmHg     Is BP treated: Yes     HDL Cholesterol: 59 mg/dL     Total Cholesterol: 151 mg/dL   ASSESSMENT  Palpitations no concerning findings on the 3-day event monitor.  Symptoms are likely due to rare ectopy which does not need to be treated at this time.  Agree with sleep study as a.m. palpitations can be caused by OSA Hypertension Dyslipidemia Obesity Type 2 diabetes Dizziness of unclear etiology repeat BP in the office today was normal and carotids auscultated without bruits   Plan  Agree with sleep study which may be the underlying etiology of her palpitations if she has undiagnosed OSA Follow-up with PCP regarding dizziness Cardiac risk counseling and prevention recommendations: Heart healthy/Mediterranean diet with whole grains, fruits, vegetable, fish, lean meats, nuts, and olive oil. Limit salt. Moderate  walking, 3-5 times/week for 30-50 minutes each session. Aim for at least 150 minutes.week. Goal should be pace of 3 miles/hour, or walking 1.5 miles in 30 minutes Avoidance of tobacco products. Avoid excess alcohol.  Follow up: As needed          Signed, Emeline FORBES Calender, MD

## 2024-04-09 ENCOUNTER — Encounter

## 2024-04-10 ENCOUNTER — Ambulatory Visit (INDEPENDENT_AMBULATORY_CARE_PROVIDER_SITE_OTHER): Admitting: Neurology

## 2024-04-10 DIAGNOSIS — Z9189 Other specified personal risk factors, not elsewhere classified: Secondary | ICD-10-CM

## 2024-04-10 DIAGNOSIS — R519 Headache, unspecified: Secondary | ICD-10-CM

## 2024-04-10 DIAGNOSIS — R0683 Snoring: Secondary | ICD-10-CM | POA: Diagnosis not present

## 2024-04-10 DIAGNOSIS — E66811 Obesity, class 1: Secondary | ICD-10-CM

## 2024-04-10 DIAGNOSIS — G4763 Sleep related bruxism: Secondary | ICD-10-CM

## 2024-04-10 DIAGNOSIS — R0689 Other abnormalities of breathing: Secondary | ICD-10-CM

## 2024-04-10 DIAGNOSIS — R351 Nocturia: Secondary | ICD-10-CM

## 2024-04-10 DIAGNOSIS — G4720 Circadian rhythm sleep disorder, unspecified type: Secondary | ICD-10-CM

## 2024-04-15 ENCOUNTER — Encounter: Payer: Self-pay | Admitting: Internal Medicine

## 2024-04-16 ENCOUNTER — Encounter: Payer: Self-pay | Admitting: Internal Medicine

## 2024-04-17 NOTE — Procedures (Signed)
 Physician Interpretation: Please see link under Procedure Tab or under Encounters tab for physician report, technical report, as well as O2 titration and/or PAP titration tables (if applicable).

## 2024-04-22 ENCOUNTER — Ambulatory Visit: Payer: Self-pay | Admitting: Neurology

## 2024-04-26 ENCOUNTER — Ambulatory Visit

## 2024-04-29 ENCOUNTER — Ambulatory Visit (INDEPENDENT_AMBULATORY_CARE_PROVIDER_SITE_OTHER)

## 2024-04-29 VITALS — BP 124/70 | HR 81 | Temp 98.3°F | Ht 65.0 in | Wt 195.0 lb

## 2024-04-29 DIAGNOSIS — Z23 Encounter for immunization: Secondary | ICD-10-CM

## 2024-04-29 NOTE — Progress Notes (Signed)
 Patient presents today for a flu vaccine. Flu vaccine was given in the right deltoid. Patient tolerated injection well. YL,RMA

## 2024-05-09 ENCOUNTER — Other Ambulatory Visit: Payer: Self-pay | Admitting: Medical Genetics

## 2024-05-14 ENCOUNTER — Ambulatory Visit (INDEPENDENT_AMBULATORY_CARE_PROVIDER_SITE_OTHER): Payer: Self-pay | Admitting: Plastic Surgery

## 2024-05-14 DIAGNOSIS — D1801 Hemangioma of skin and subcutaneous tissue: Secondary | ICD-10-CM

## 2024-05-14 NOTE — Progress Notes (Signed)
 Preoperative Dx: angioma of lip  Postoperative Dx:  same  Procedure: laser to lip 1 cm  Anesthesia: none  Description of Procedure:  Risks and complications were explained to the patient. Consent was confirmed and signed. Eye protection was placed. Time out was called and all information was confirmed to be correct. The area  area was prepped with alcohol and wiped dry. The Heroic laser was set at 590 nm and preset J/cm2. The lip was lasered. The patient tolerated the procedure well and there were no complications. The patient is to follow up in 4 weeks.

## 2024-06-24 ENCOUNTER — Encounter: Payer: Self-pay | Admitting: Internal Medicine

## 2024-06-24 ENCOUNTER — Ambulatory Visit: Payer: Self-pay | Admitting: Internal Medicine

## 2024-06-25 ENCOUNTER — Other Ambulatory Visit: Payer: Self-pay

## 2024-06-25 MED ORDER — ATORVASTATIN CALCIUM 40 MG PO TABS: ORAL_TABLET | ORAL | 0 refills | Status: AC

## 2024-06-25 MED ORDER — VALSARTAN 160 MG PO TABS: 160.0000 mg | ORAL_TABLET | Freq: Every day | ORAL | 0 refills | Status: AC

## 2024-06-30 ENCOUNTER — Other Ambulatory Visit: Payer: Self-pay | Admitting: Internal Medicine

## 2024-07-08 ENCOUNTER — Other Ambulatory Visit: Payer: Self-pay | Admitting: Medical Genetics

## 2024-07-08 DIAGNOSIS — Z006 Encounter for examination for normal comparison and control in clinical research program: Secondary | ICD-10-CM

## 2024-07-31 LAB — GENECONNECT MOLECULAR SCREEN: Genetic Analysis Overall Interpretation: NEGATIVE

## 2024-09-24 ENCOUNTER — Other Ambulatory Visit: Admitting: Plastic Surgery

## 2025-02-26 ENCOUNTER — Encounter: Payer: Self-pay | Admitting: Internal Medicine
